# Patient Record
Sex: Female | Born: 1940 | Race: Black or African American | Hispanic: No | Smoking: Former smoker
Health system: Southern US, Community
[De-identification: ages and names within clinical notes are randomized; demographics above are authoritative.]

## PROBLEM LIST (undated history)

## (undated) DIAGNOSIS — N189 Chronic kidney disease, unspecified: Secondary | ICD-10-CM

## (undated) DIAGNOSIS — I5042 Chronic combined systolic (congestive) and diastolic (congestive) heart failure: Secondary | ICD-10-CM

## (undated) DIAGNOSIS — J449 Chronic obstructive pulmonary disease, unspecified: Secondary | ICD-10-CM

## (undated) DIAGNOSIS — I10 Essential (primary) hypertension: Secondary | ICD-10-CM

## (undated) DIAGNOSIS — E785 Hyperlipidemia, unspecified: Secondary | ICD-10-CM

## (undated) HISTORY — PX: APPENDECTOMY: SHX54

---

## 2017-06-01 ENCOUNTER — Encounter: Payer: Self-pay | Admitting: Internal Medicine

## 2017-06-01 ENCOUNTER — Emergency Department: Payer: Medicare Other

## 2017-06-01 ENCOUNTER — Inpatient Hospital Stay
Admission: EM | Admit: 2017-06-01 | Discharge: 2017-06-09 | DRG: 871 | Disposition: A | Discharge status: Hospice | Payer: Medicare Other | Attending: Internal Medicine | Admitting: Internal Medicine

## 2017-06-01 DIAGNOSIS — R0603 Acute respiratory distress: Secondary | ICD-10-CM

## 2017-06-01 DIAGNOSIS — Z87891 Personal history of nicotine dependence: Secondary | ICD-10-CM | POA: Diagnosis not present

## 2017-06-01 DIAGNOSIS — N184 Chronic kidney disease, stage 4 (severe): Secondary | ICD-10-CM | POA: Diagnosis not present

## 2017-06-01 DIAGNOSIS — Z79899 Other long term (current) drug therapy: Secondary | ICD-10-CM

## 2017-06-01 DIAGNOSIS — I130 Hypertensive heart and chronic kidney disease with heart failure and stage 1 through stage 4 chronic kidney disease, or unspecified chronic kidney disease: Secondary | ICD-10-CM | POA: Diagnosis present

## 2017-06-01 DIAGNOSIS — D649 Anemia, unspecified: Secondary | ICD-10-CM | POA: Diagnosis present

## 2017-06-01 DIAGNOSIS — F411 Generalized anxiety disorder: Secondary | ICD-10-CM | POA: Diagnosis present

## 2017-06-01 DIAGNOSIS — I5043 Acute on chronic combined systolic (congestive) and diastolic (congestive) heart failure: Secondary | ICD-10-CM | POA: Diagnosis present

## 2017-06-01 DIAGNOSIS — J9622 Acute and chronic respiratory failure with hypercapnia: Secondary | ICD-10-CM | POA: Diagnosis present

## 2017-06-01 DIAGNOSIS — Z7189 Other specified counseling: Secondary | ICD-10-CM | POA: Diagnosis not present

## 2017-06-01 DIAGNOSIS — Z66 Do not resuscitate: Secondary | ICD-10-CM | POA: Diagnosis present

## 2017-06-01 DIAGNOSIS — J441 Chronic obstructive pulmonary disease with (acute) exacerbation: Secondary | ICD-10-CM | POA: Diagnosis present

## 2017-06-01 DIAGNOSIS — F410 Panic disorder [episodic paroxysmal anxiety] without agoraphobia: Secondary | ICD-10-CM | POA: Diagnosis present

## 2017-06-01 DIAGNOSIS — Z515 Encounter for palliative care: Secondary | ICD-10-CM

## 2017-06-01 DIAGNOSIS — A412 Sepsis due to unspecified staphylococcus: Secondary | ICD-10-CM | POA: Diagnosis present

## 2017-06-01 DIAGNOSIS — I429 Cardiomyopathy, unspecified: Secondary | ICD-10-CM | POA: Diagnosis present

## 2017-06-01 DIAGNOSIS — E785 Hyperlipidemia, unspecified: Secondary | ICD-10-CM | POA: Diagnosis present

## 2017-06-01 DIAGNOSIS — J9621 Acute and chronic respiratory failure with hypoxia: Secondary | ICD-10-CM | POA: Diagnosis present

## 2017-06-01 DIAGNOSIS — I248 Other forms of acute ischemic heart disease: Secondary | ICD-10-CM | POA: Diagnosis present

## 2017-06-01 DIAGNOSIS — G9340 Encephalopathy, unspecified: Secondary | ICD-10-CM | POA: Diagnosis present

## 2017-06-01 DIAGNOSIS — I10 Essential (primary) hypertension: Secondary | ICD-10-CM | POA: Diagnosis present

## 2017-06-01 DIAGNOSIS — R0600 Dyspnea, unspecified: Secondary | ICD-10-CM

## 2017-06-01 DIAGNOSIS — F329 Major depressive disorder, single episode, unspecified: Secondary | ICD-10-CM | POA: Diagnosis present

## 2017-06-01 DIAGNOSIS — Z9981 Dependence on supplemental oxygen: Secondary | ICD-10-CM | POA: Diagnosis not present

## 2017-06-01 DIAGNOSIS — Z7951 Long term (current) use of inhaled steroids: Secondary | ICD-10-CM | POA: Diagnosis not present

## 2017-06-01 HISTORY — DX: Essential (primary) hypertension: I10

## 2017-06-01 HISTORY — DX: Chronic kidney disease, unspecified: N18.9

## 2017-06-01 HISTORY — DX: Chronic obstructive pulmonary disease, unspecified: J44.9

## 2017-06-01 HISTORY — DX: Hyperlipidemia, unspecified: E78.5

## 2017-06-01 HISTORY — DX: Chronic combined systolic (congestive) and diastolic (congestive) heart failure: I50.42

## 2017-06-01 LAB — BASIC METABOLIC PANEL
ANION GAP: 11 (ref 5–15)
BUN: 36 mg/dL — AB (ref 6–20)
CO2: 25 mmol/L (ref 22–32)
Calcium: 9.5 mg/dL (ref 8.9–10.3)
Chloride: 105 mmol/L (ref 101–111)
Creatinine, Ser: 1.86 mg/dL — ABNORMAL HIGH (ref 0.44–1.00)
GFR calc Af Amer: 30 mL/min — ABNORMAL LOW (ref >60)
GFR, EST NON AFRICAN AMERICAN: 26 mL/min — AB (ref >60)
GLUCOSE: 142 mg/dL — AB (ref 65–99)
Potassium: 5.2 mmol/L — ABNORMAL HIGH (ref 3.5–5.1)
Sodium: 141 mmol/L (ref 135–145)

## 2017-06-01 LAB — CBC WITH DIFFERENTIAL/PLATELET
Basophils Absolute: 0.1 10*3/uL (ref 0–0.1)
Basophils Relative: 1 %
Eosinophils Absolute: 0.3 10*3/uL (ref 0–0.7)
Eosinophils Relative: 3 %
HEMATOCRIT: 26.9 % — AB (ref 35.0–47.0)
HEMOGLOBIN: 8.7 g/dL — AB (ref 12.0–16.0)
Lymphocytes Relative: 11 %
Lymphs Abs: 1.1 10*3/uL (ref 1.0–3.6)
MCH: 26.6 pg (ref 26.0–34.0)
MCHC: 32.3 g/dL (ref 32.0–36.0)
MCV: 82.3 fL (ref 80.0–100.0)
MONO ABS: 0.8 10*3/uL (ref 0.2–0.9)
MONOS PCT: 8 %
NEUTROS ABS: 7.9 10*3/uL — AB (ref 1.4–6.5)
Neutrophils Relative %: 77 %
Platelets: 282 10*3/uL (ref 150–440)
RBC: 3.27 MIL/uL — ABNORMAL LOW (ref 3.80–5.20)
RDW: 21 % — AB (ref 11.5–14.5)
WBC: 10.2 10*3/uL (ref 3.6–11.0)

## 2017-06-01 LAB — BLOOD GAS, VENOUS
ACID-BASE EXCESS: 2.6 mmol/L — AB (ref 0.0–2.0)
BICARBONATE: 28.7 mmol/L — AB (ref 20.0–28.0)
FIO2: 1
O2 Saturation: 99.9 %
PATIENT TEMPERATURE: 37
PO2 VEN: 290 mmHg — AB (ref 32.0–45.0)
pCO2, Ven: 52 mmHg (ref 44.0–60.0)
pH, Ven: 7.35 (ref 7.250–7.430)

## 2017-06-01 LAB — PROTIME-INR
INR: 1.03
Prothrombin Time: 13.4 seconds (ref 11.4–15.2)

## 2017-06-01 LAB — BRAIN NATRIURETIC PEPTIDE: B Natriuretic Peptide: 656 pg/mL — ABNORMAL HIGH (ref 0.0–100.0)

## 2017-06-01 LAB — TROPONIN I: Troponin I: 0.05 ng/mL (ref <0.03)

## 2017-06-01 MED ORDER — IPRATROPIUM-ALBUTEROL 0.5-2.5 (3) MG/3ML IN SOLN
3.0000 mL | Freq: Once | RESPIRATORY_TRACT | Status: AC
  Administered 2017-06-01: 3 mL via RESPIRATORY_TRACT

## 2017-06-01 MED ORDER — MORPHINE SULFATE (PF) 2 MG/ML IV SOLN
INTRAVENOUS | Status: AC
  Filled 2017-06-01: qty 1

## 2017-06-01 MED ORDER — MORPHINE SULFATE (PF) 2 MG/ML IV SOLN
2.0000 mg | Freq: Once | INTRAVENOUS | Status: AC
  Administered 2017-06-01: 2 mg via INTRAVENOUS

## 2017-06-01 NOTE — ED Provider Notes (Signed)
[facility] Emergency Department Provider Note ____________________________________________   First MD Initiated Contact with Patient 06/01/17 1914     (approximate)  I have reviewed the triage vital signs and the nursing notes.   HISTORY  Chief Complaint Respiratory Distress  HPI severely limited due to acute respiratory distress  HPI Erica [name] is a 76 y.o. female wth a history of CHF, COPD, and chronic kidney disease who presents from her facility with shortness of breath, acute onset within the last hour and patient was walking.  Patient unable to provide further hx.   No past medical history on file.  There are no active problems to display for this patient.   No past surgical history on file.  Prior to Admission medications   Medication Sig Start Date End Date Taking? Authorizing Provider  allopurinol (ZYLOPRIM) 100 MG tablet Take 100 mg by mouth daily.   Yes [provider]  busPIRone (BUSPAR) 7.5 MG tablet Take 7.5 mg by mouth 3 (three) times daily.   Yes [provider]  citalopram (CELEXA) 20 MG tablet Take 20 mg by mouth daily.   Yes [provider]  fluticasone (FLONASE) 50 MCG/ACT nasal spray Place 2 sprays into both nostrils daily.   Yes [provider]  lidocaine (XYLOCAINE) 5 % ointment Apply 1 application topically daily as needed.   Yes [provider]  metolazone (ZAROXOLYN) 2.5 MG tablet Take 2.5 mg by mouth daily.   Yes [provider]  mirtazapine (REMERON) 15 MG tablet Take 15 mg by mouth at bedtime.   Yes [provider]  omeprazole (PRILOSEC) 40 MG capsule Take 40 mg by mouth daily.   Yes [provider]  oxyCODONE (OXY IR/ROXICODONE) 5 MG immediate release tablet Take 5 mg by mouth every 4 (four) hours as needed for severe pain.   Yes [provider]  predniSONE (DELTASONE) 1 MG tablet Take 2 mg by mouth daily with breakfast.   Yes [provider]  roflumilast (DALIRESP) 500 MCG TABS tablet Take 500 mcg by mouth daily.   Yes [provider]  spironolactone (ALDACTONE) 25 MG tablet Take 25 mg by mouth daily.   Yes [provider]  tiotropium (SPIRIVA) 18 MCG inhalation capsule Place 18 mcg into inhaler and inhale daily.   Yes [provider]    Allergies Patient has no known allergies.  No family history on file.  Social History Social History  Substance Use Topics  . Smoking status: Not on file  . Smokeless tobacco: Not on file  . Alcohol use Not on file    Review of Systems Level V caveat: unable to obtain ROS due to acute respiratory distress   ____________________________________________   PHYSICAL EXAM:  VITAL SIGNS: ED Triage Vitals [06/01/17 1915]  Enc Vitals Group     BP (!) 154/86     Pulse Rate (!) 114     Resp (!) 30     Temp 98.3 F (36.8 C)     Temp Source Axillary     SpO2 100 %     Weight      Height      Head Circumference      Peak Flow      Pain Score 9     Pain Loc      Pain Edu?      Excl. in GC?     Constitutional: Alert, uncomfortable appearing.  Eyes: Conjunctivae are normal.  Head: Atraumatic. Nose: No  congestion/rhinnorhea. Mouth/Throat: Mucous membranes are slightly dry.   Neck: Normal range of motion.  Cardiovascular: Tachycardia, regular rhythm. Grossly normal heart sounds.  Good peripheral circulation. Respiratory: Moderate resp distress. Coarse breath sounds and scattered wheeze.  No rales.  Gastrointestinal: No distention.  Genitourinary: No CVA tenderness. Musculoskeletal: No lower extremity edema.  Extremities warm and well perfused.  Neurologic:  Normal speech and language. No gross focal neurologic deficits are appreciated.  Skin:  Skin is warm and dry. No rash noted. Psychiatric: Mood and affect are normal. Speech and behavior are normal.  ____________________________________________   LABS (all labs ordered are  listed, but only abnormal results are displayed)  Labs Reviewed  BASIC METABOLIC PANEL - Abnormal; Notable for the following:       Result Value   Potassium 5.2 (*)    Glucose, Bld 142 (*)    BUN 36 (*)    Creatinine, Ser 1.86 (*)    GFR calc non Af Amer 26 (*)    GFR calc Af Amer 30 (*)    All other components within normal limits  CBC WITH DIFFERENTIAL/PLATELET - Abnormal; Notable for the following:    RBC 3.27 (*)    Hemoglobin 8.7 (*)    HCT 26.9 (*)    RDW 21.0 (*)    Neutro Abs 7.9 (*)    All other components within normal limits  BRAIN NATRIURETIC PEPTIDE - Abnormal; Notable for the following:    B Natriuretic Peptide 656.0 (*)    All other components within normal limits  TROPONIN I - Abnormal; Notable for the following:    Troponin I 0.05 (*)    All other components within normal limits  BLOOD GAS, VENOUS - Abnormal; Notable for the following:    pO2, Ven 290.0 (*)    Bicarbonate 28.7 (*)    Acid-Base Excess 2.6 (*)    All other components within normal limits  CULTURE, BLOOD (ROUTINE X 2)  CULTURE, BLOOD (ROUTINE X 2)  PROTIME-INR  URINALYSIS, COMPLETE (UACMP) WITH MICROSCOPIC   ____________________________________________  EKG  ED ECG REPORT I, Dionne Bucy, the attending physician, personally viewed and interpreted this ECG.  Date: 06/01/2017 EKG Time: 19:14 Rate: 115 Rhythm: sinus tachycardia, PVCs QRS Axis: normal Intervals: normal ST/T Wave abnormalities: nonspecific T-wave flattening laterally Narrative Interpretation: nonspecific findings, no evidence of acute ischemia; no old EKG available for comparison  ____________________________________________  RADIOLOGY  CXR: Hyperinflation with emphasematous changes.  No infiltrate or pulmonary edema.  ____________________________________________   PROCEDURES  Procedure(s) performed: No    Critical Care performed: Yes  CRITICAL CARE Performed by: Dionne Bucy   Total critical  care time: 35 minutes  Critical care time was exclusive of separately billable procedures and treating other patients.  Critical care was necessary to treat or prevent imminent or life-threatening deterioration.  Critical care was time spent personally by me on the following activities: development of treatment plan with patient and/or surrogate as well as nursing, discussions with consultants, evaluation of patient's response to treatment, examination of patient, obtaining history from patient or surrogate, ordering and performing treatments and interventions, ordering and review of laboratory studies, ordering and review of radiographic studies, pulse oximetry and re-evaluation of patient's condition.  ____________________________________________   INITIAL IMPRESSION / ASSESSMENT AND PLAN / ED COURSE  Pertinent labs & imaging results that were available during my care of the patient were reviewed by me and considered in my medical decision making (see chart for details).  76 year old female with a history  of COPD and CHF presents with acute onset of shortness of breath, in respiratory distress. Patient was placed on CPAP and given Solu-Medrol by EMS. On arrival patient tachypneic and uncomfortable appearing. Maintaining O2 sat 100% after being placed on BiPAP in the emergency department.  Initial clinical presentation most consistent with acute COPD exacerbation rather than CHF, given no rales and no peripheral edema.  Also consider pna.  Bedside sono performed by me and CXR also consistent with COPD rather than CHF.  Pt given continuous nebs and had rapid improvement on BiPAP.  Plan for admission for COPD.  ----------------------------------------- 8:48 PM on 06/01/2017 -----------------------------------------  Patient remains stable on the BiPAP. Signed out to hospitalist for admission.  No evidence by CXR or labs for pna.       ____________________________________________   FINAL  CLINICAL IMPRESSION(S) / ED DIAGNOSES  Final diagnoses:  COPD exacerbation (HCC)  Respiratory distress      NEW MEDICATIONS STARTED DURING THIS VISIT:  New Prescriptions   No medications on file     Note:  This document was prepared using Dragon voice recognition software and may include unintentional dictation errors.    Dionne Bucy, MD 06/01/17 2050

## 2017-06-01 NOTE — ED Notes (Signed)
Pt's daughter to nurses station to ask about time. Visitors in room given coffee by this RN and informed that their nurse is waiting on a bed for the pt.

## 2017-06-01 NOTE — Progress Notes (Signed)
Pt now on home settings [facility] tolerating well. Pt awake and alert without any signs of respiratory distress

## 2017-06-01 NOTE — ED Notes (Signed)
Pt cleansed of incontinent urine.  

## 2017-06-01 NOTE — ED Notes (Signed)
Delay in taking pt to floor, attempting to wean pt from bipap. Rt in to remove bipap and place pt on different delivery of oxygen.

## 2017-06-01 NOTE — ED Notes (Signed)
Po fluids provided. Pt updated on delay to room.

## 2017-06-01 NOTE — ED Notes (Signed)
Pt requesting medication for leg pain.

## 2017-06-01 NOTE — ED Triage Notes (Signed)
Pt arrives ems with resp distress. Pt arrives on bipap, solumedrol  given iv by ems. Pt with diaphoretic skin, normal color, sinus tach on monitor, md at bedside on arrival. Per ems pt with ra pox of 88% on their arrival.

## 2017-06-01 NOTE — H&P (Signed)
[facility] [place] at [facility]   PATIENT NAME: Erica [name]    MR#:  [id number]  DATE OF BIRTH:  [birth year]  DATE OF ADMISSION:  06/01/2017  PRIMARY CARE PHYSICIAN: System, Pcp Not In   REQUESTING/REFERRING PHYSICIAN: Siadecki, MD  CHIEF COMPLAINT:   Chief Complaint  Patient presents with  . Respiratory Distress    HISTORY OF PRESENT ILLNESS:  Erica [name]  is a 76 y.o. female who presents with Progressive shortness of breath over the last several days. Patient has a history of severe COPD as well as congestive heart failure. She came in to the ED today when her symptoms became severe. Initially she was somewhat hypoxic and required BiPAP. She is able to wean off the BiPAP in the ED. Workup was consistent with COPD exacerbation, as well as potentially some mild heart failure exacerbation. Hospitalists were called for admission  PAST MEDICAL HISTORY:   Past Medical History:  Diagnosis Date  . Chronic combined systolic and diastolic CHF (congestive heart failure) (HCC)   . CKD (chronic kidney disease)   . COPD (chronic obstructive pulmonary disease) (HCC)   . HLD (hyperlipidemia)   . HTN (hypertension)     PAST SURGICAL HISTORY:   Past Surgical History:  Procedure Laterality Date  . APPENDECTOMY      SOCIAL HISTORY:   Social History  Substance Use Topics  . Smoking status: Former Games developer  . Smokeless tobacco: Not on file  . Alcohol use No    FAMILY HISTORY:   Family History  Problem Relation Age of Onset  . Cancer Mother   . Hypertension Mother   . Heart disease Sister     DRUG ALLERGIES:  No Known Allergies  MEDICATIONS AT HOME:   Prior to Admission medications   Medication Sig Start Date End Date Taking? Authorizing Provider  allopurinol (ZYLOPRIM) 100 MG tablet Take 100 mg by mouth daily.   Yes [provider]  busPIRone (BUSPAR) 7.5 MG tablet Take 7.5 mg by mouth 3 (three) times daily.   Yes [provider]  citalopram (CELEXA) 20 MG tablet Take 20 mg by mouth daily.   Yes [provider]  fluticasone (FLONASE) 50 MCG/ACT nasal spray Place 2 sprays into both nostrils daily.   Yes [provider]  lidocaine (XYLOCAINE) 5 % ointment Apply 1 application topically daily as needed.   Yes [provider]  metolazone (ZAROXOLYN) 2.5 MG tablet Take 2.5 mg by mouth daily.   Yes [provider]  mirtazapine (REMERON) 15 MG tablet Take 15 mg by mouth at bedtime.   Yes [provider]  omeprazole (PRILOSEC) 40 MG capsule Take 40 mg by mouth daily.   Yes [provider]  oxyCODONE (OXY IR/ROXICODONE) 5 MG immediate release tablet Take 5 mg by mouth every 4 (four) hours as needed for severe pain.   Yes [provider]  predniSONE (DELTASONE) 1 MG tablet Take 2 mg by mouth daily with breakfast.   Yes [provider]  roflumilast (DALIRESP) 500 MCG TABS tablet Take 500 mcg by mouth daily.   Yes [provider]  spironolactone (ALDACTONE) 25 MG tablet Take 25 mg by mouth daily.   Yes [provider]  tiotropium (SPIRIVA) 18 MCG inhalation capsule Place 18 mcg into inhaler and inhale daily.   Yes [provider]    REVIEW OF SYSTEMS:  Review of Systems  Constitutional: Negative for chills, fever, malaise/fatigue and weight loss.  HENT: Negative for  ear pain, hearing loss and tinnitus.   Eyes: Negative for blurred vision, double vision, pain and redness.  Respiratory: Positive for cough, shortness of breath and wheezing. Negative for hemoptysis.   Cardiovascular: Negative for chest pain, palpitations, orthopnea and leg swelling.  Gastrointestinal: Negative for abdominal pain, constipation, diarrhea, nausea and vomiting.  Genitourinary: Negative for dysuria, frequency and hematuria.  Musculoskeletal: Negative for back pain, joint pain and neck pain.  Skin:       No acne, rash, or lesions   Neurological: Negative for dizziness, tremors, focal weakness and weakness.  Endo/Heme/Allergies: Negative for polydipsia. Does not bruise/bleed easily.  Psychiatric/Behavioral: Negative for depression. The patient is not nervous/anxious and does not have insomnia.      VITAL SIGNS:   Vitals:   06/01/17 2045 06/01/17 2100 06/01/17 2115 06/01/17 2130  BP:  120/82  123/70  Pulse: (!) 108 (!) 110 (!) 107 (!) 111  Resp: 17 (!) Temp:      TempSrc:      SpO2: 100% 100% 100% 100%  Weight:      Height:       Wt Readings from Last 3 Encounters:  06/01/17 76.4 kg (168 lb 6.9 oz)    PHYSICAL EXAMINATION:  Physical Exam  Vitals reviewed. Constitutional: She is oriented to person, place, and time. She appears well-developed and well-nourished. No distress.  HENT:  Head: Normocephalic and atraumatic.  Mouth/Throat: Oropharynx is clear and moist.  Eyes: Pupils are equal, round, and reactive to light. Conjunctivae and EOM are normal. No scleral icterus.  Neck: Normal range of motion. Neck supple. No JVD present. No thyromegaly present.  Cardiovascular: Normal rate, regular rhythm and intact distal pulses.  Exam reveals no gallop and no friction rub.   No murmur heard. Respiratory: Effort normal. No respiratory distress. She has wheezes. She has rales.  GI: Soft. Bowel sounds are normal. She exhibits no distension. There is no tenderness.  Musculoskeletal: Normal range of motion. She exhibits no edema.  No arthritis, no gout  Lymphadenopathy:    She has no cervical adenopathy.  Neurological: She is alert and oriented to person, place, and time. No cranial nerve deficit.  No dysarthria, no aphasia  Skin: Skin is warm and dry. No rash noted. No erythema.  Psychiatric: She has a normal mood and affect. Her behavior is normal. Judgment and thought content normal.    LABORATORY PANEL:   CBC  Recent Labs Lab 06/01/17 1916  WBC 10.2  HGB 8.7*  HCT 26.9*  PLT 282    ------------------------------------------------------------------------------------------------------------------  Chemistries   Recent Labs Lab 06/01/17 1916  NA 141  K 5.2*  CL 105  CO2 25  GLUCOSE 142*  BUN 36*  CREATININE 1.86*  CALCIUM 9.5   ------------------------------------------------------------------------------------------------------------------  Cardiac Enzymes  Recent Labs Lab 06/01/17 1916  TROPONINI 0.05*   ------------------------------------------------------------------------------------------------------------------  RADIOLOGY:  Dg Chest Portable 1 View  Result Date: 06/01/2017 CLINICAL DATA:  Shortness of breath EXAM: PORTABLE CHEST 1 VIEW COMPARISON:  None. FINDINGS: Hyperinflation with emphysematous changes. No acute consolidation. Linear scarring or atelectasis at the bases. No pleural effusion. Borderline cardiomegaly. Aortic atherosclerosis. No pneumothorax. IMPRESSION: Hyperinflation with emphysematous changes. Linear scarring or atelectasis at both bases Electronically Signed   By: Jasmine Pang M.D.   On: 06/01/2017 19:43    EKG:   Orders placed or performed during the hospital encounter of 06/01/17  . EKG 12-Lead  . EKG 12-Lead  . ED EKG  . ED EKG  IMPRESSION AND PLAN:  Principal Problem:   COPD with acute exacerbation (HCC) - IV Solu-Medrol, azithromycin, duo nebs, when necessary antitussive Active Problems:   Acute on chronic combined systolic and diastolic CHF (congestive heart failure) (HCC) - BNP is mildly elevated at around 600, though she has an unknown baseline. Chart review from outside facility shows proBNP values consistently elevated. We will get an echocardiogram and cardiology consult   HTN (hypertension) - continue home antihypertensives   CKD (chronic kidney disease), stage IV (HCC) - renal function at baseline, avoid nephrotoxins and monitor   HLD (hyperlipidemia) - home dose statin  All the records are  reviewed and case discussed with ED provider. Management plans discussed with the patient and/or family.  DVT PROPHYLAXIS: SubQ heparin  GI PROPHYLAXIS: None  ADMISSION STATUS: Inpatient  CODE STATUS: Full Code Status History    This patient does not have a recorded code status. Please follow your organizational policy for patients in this situation.      TOTAL TIME TAKING CARE OF THIS PATIENT: 45 minutes.   Selma Rodelo FIELDING 06/01/2017, 9:34 PM  [employer]  [phone]  CC: Primary care physician; System, Pcp Not In  Note:  This document was prepared using Dragon voice recognition software and may include unintentional dictation errors.

## 2017-06-01 NOTE — ED Notes (Signed)
Pt reports feeling improved, breath sounds improved. Family remains at bedise.

## 2017-06-01 NOTE — ED Notes (Signed)
Critical troponin of 0.05 called from lab. Dr. Guss Bunde notified, no new orders received.

## 2017-06-01 NOTE — ED Notes (Signed)
Pt tolerating removal from bipap well. Pt repositioned in bed for comfort.

## 2017-06-02 ENCOUNTER — Encounter: Payer: Self-pay | Admitting: General Practice

## 2017-06-02 ENCOUNTER — Inpatient Hospital Stay
Admit: 2017-06-02 | Discharge: 2017-06-02 | Disposition: A | Discharge status: Home / Self Care | Payer: Medicare Other | Attending: Internal Medicine | Admitting: Internal Medicine

## 2017-06-02 LAB — BASIC METABOLIC PANEL
Anion gap: 7 (ref 5–15)
BUN: 37 mg/dL — AB (ref 6–20)
CO2: 27 mmol/L (ref 22–32)
CREATININE: 1.7 mg/dL — AB (ref 0.44–1.00)
Calcium: 9 mg/dL (ref 8.9–10.3)
Chloride: 105 mmol/L (ref 101–111)
GFR calc Af Amer: 33 mL/min — ABNORMAL LOW (ref >60)
GFR, EST NON AFRICAN AMERICAN: 28 mL/min — AB (ref >60)
Glucose, Bld: 161 mg/dL — ABNORMAL HIGH (ref 65–99)
Potassium: 4.9 mmol/L (ref 3.5–5.1)
SODIUM: 139 mmol/L (ref 135–145)

## 2017-06-02 LAB — CBC
HCT: 25.6 % — ABNORMAL LOW (ref 35.0–47.0)
Hemoglobin: 8.3 g/dL — ABNORMAL LOW (ref 12.0–16.0)
MCH: 26.7 pg (ref 26.0–34.0)
MCHC: 32.5 g/dL (ref 32.0–36.0)
MCV: 82.3 fL (ref 80.0–100.0)
PLATELETS: 246 10*3/uL (ref 150–440)
RBC: 3.11 MIL/uL — ABNORMAL LOW (ref 3.80–5.20)
RDW: 20.8 % — AB (ref 11.5–14.5)
WBC: 5 10*3/uL (ref 3.6–11.0)

## 2017-06-02 LAB — ECHOCARDIOGRAM COMPLETE
HEIGHTINCHES: 65 in
Weight: 2416 oz

## 2017-06-02 LAB — TROPONIN I
TROPONIN I: 0.03 ng/mL — AB (ref <0.03)
Troponin I: 0.04 ng/mL (ref <0.03)

## 2017-06-02 LAB — BLOOD CULTURE ID PANEL (REFLEXED)
ACINETOBACTER BAUMANNII: NOT DETECTED
CANDIDA ALBICANS: NOT DETECTED
CANDIDA TROPICALIS: NOT DETECTED
Candida glabrata: NOT DETECTED
Candida krusei: NOT DETECTED
Candida parapsilosis: NOT DETECTED
ENTEROBACTERIACEAE SPECIES: NOT DETECTED
Enterobacter cloacae complex: NOT DETECTED
Enterococcus species: NOT DETECTED
Escherichia coli: NOT DETECTED
HAEMOPHILUS INFLUENZAE: NOT DETECTED
Klebsiella oxytoca: NOT DETECTED
Klebsiella pneumoniae: NOT DETECTED
Listeria monocytogenes: NOT DETECTED
METHICILLIN RESISTANCE: DETECTED — AB
NEISSERIA MENINGITIDIS: NOT DETECTED
PSEUDOMONAS AERUGINOSA: NOT DETECTED
Proteus species: NOT DETECTED
SERRATIA MARCESCENS: NOT DETECTED
STAPHYLOCOCCUS AUREUS BCID: NOT DETECTED
STREPTOCOCCUS PNEUMONIAE: NOT DETECTED
STREPTOCOCCUS PYOGENES: NOT DETECTED
STREPTOCOCCUS SPECIES: NOT DETECTED
Staphylococcus species: DETECTED — AB
Streptococcus agalactiae: NOT DETECTED

## 2017-06-02 LAB — GLUCOSE, CAPILLARY: GLUCOSE-CAPILLARY: 141 mg/dL — AB (ref 65–99)

## 2017-06-02 LAB — MRSA PCR SCREENING: MRSA by PCR: NEGATIVE

## 2017-06-02 MED ORDER — AZITHROMYCIN 500 MG PO TABS
500.0000 mg | ORAL_TABLET | Freq: Every day | ORAL | Status: DC
  Administered 2017-06-02 – 2017-06-03 (×2): 500 mg via ORAL
  Filled 2017-06-02 (×5): qty 1

## 2017-06-02 MED ORDER — ORAL CARE MOUTH RINSE
15.0000 mL | Freq: Two times a day (BID) | OROMUCOSAL | Status: DC
  Administered 2017-06-03 – 2017-06-09 (×10): 15 mL via OROMUCOSAL

## 2017-06-02 MED ORDER — IPRATROPIUM-ALBUTEROL 0.5-2.5 (3) MG/3ML IN SOLN
3.0000 mL | RESPIRATORY_TRACT | Status: DC | PRN
  Administered 2017-06-02 (×2): 3 mL via RESPIRATORY_TRACT
  Filled 2017-06-02 (×2): qty 3

## 2017-06-02 MED ORDER — CLONAZEPAM 0.5 MG PO TABS
0.2500 mg | ORAL_TABLET | Freq: Two times a day (BID) | ORAL | Status: DC | PRN
  Administered 2017-06-02 – 2017-06-05 (×3): 0.25 mg via ORAL
  Filled 2017-06-02 (×4): qty 1

## 2017-06-02 MED ORDER — ACETAMINOPHEN 650 MG RE SUPP: 650.0000 mg | Freq: Four times a day (QID) | RECTAL | Status: DC | PRN

## 2017-06-02 MED ORDER — PANTOPRAZOLE SODIUM 40 MG PO TBEC
40.0000 mg | DELAYED_RELEASE_TABLET | Freq: Every day | ORAL | Status: DC
  Administered 2017-06-02 – 2017-06-06 (×3): 40 mg via ORAL
  Filled 2017-06-02 (×3): qty 1

## 2017-06-02 MED ORDER — ONDANSETRON HCL 4 MG/2ML IJ SOLN: 4.0000 mg | Freq: Four times a day (QID) | INTRAMUSCULAR | Status: DC | PRN

## 2017-06-02 MED ORDER — CARVEDILOL 3.125 MG PO TABS
3.1250 mg | ORAL_TABLET | Freq: Two times a day (BID) | ORAL | Status: DC
  Administered 2017-06-02 – 2017-06-07 (×4): 3.125 mg via ORAL
  Filled 2017-06-02 (×5): qty 1

## 2017-06-02 MED ORDER — SPIRONOLACTONE 25 MG PO TABS
25.0000 mg | ORAL_TABLET | Freq: Every day | ORAL | Status: DC
  Administered 2017-06-02: 25 mg via ORAL
  Filled 2017-06-02: qty 1

## 2017-06-02 MED ORDER — MOMETASONE FURO-FORMOTEROL FUM 200-5 MCG/ACT IN AERO
2.0000 | INHALATION_SPRAY | Freq: Two times a day (BID) | RESPIRATORY_TRACT | Status: DC
  Administered 2017-06-02 – 2017-06-06 (×4): 2 via RESPIRATORY_TRACT
  Filled 2017-06-02: qty 8.8

## 2017-06-02 MED ORDER — ONDANSETRON HCL 4 MG PO TABS
4.0000 mg | ORAL_TABLET | Freq: Four times a day (QID) | ORAL | Status: DC | PRN
  Administered 2017-06-02: 4 mg via ORAL
  Filled 2017-06-02: qty 1

## 2017-06-02 MED ORDER — ROFLUMILAST 500 MCG PO TABS
500.0000 ug | ORAL_TABLET | Freq: Every day | ORAL | Status: DC
  Administered 2017-06-02 – 2017-06-06 (×3): 500 ug via ORAL
  Filled 2017-06-02 (×6): qty 1

## 2017-06-02 MED ORDER — CITALOPRAM HYDROBROMIDE 20 MG PO TABS
20.0000 mg | ORAL_TABLET | Freq: Every day | ORAL | Status: DC
  Administered 2017-06-02 – 2017-06-06 (×3): 20 mg via ORAL
  Filled 2017-06-02 (×4): qty 1

## 2017-06-02 MED ORDER — IPRATROPIUM-ALBUTEROL 0.5-2.5 (3) MG/3ML IN SOLN
3.0000 mL | Freq: Three times a day (TID) | RESPIRATORY_TRACT | Status: DC
  Administered 2017-06-02 (×2): 3 mL via RESPIRATORY_TRACT
  Filled 2017-06-02 (×2): qty 3

## 2017-06-02 MED ORDER — TIOTROPIUM BROMIDE MONOHYDRATE 18 MCG IN CAPS
18.0000 ug | ORAL_CAPSULE | Freq: Every day | RESPIRATORY_TRACT | Status: DC
  Administered 2017-06-02: 18 ug via RESPIRATORY_TRACT
  Filled 2017-06-02: qty 5

## 2017-06-02 MED ORDER — BUSPIRONE HCL 15 MG PO TABS
7.5000 mg | ORAL_TABLET | Freq: Three times a day (TID) | ORAL | Status: DC
  Administered 2017-06-02 – 2017-06-06 (×8): 7.5 mg via ORAL
  Filled 2017-06-02 (×16): qty 1

## 2017-06-02 MED ORDER — METHYLPREDNISOLONE SODIUM SUCC 125 MG IJ SOLR
60.0000 mg | Freq: Four times a day (QID) | INTRAMUSCULAR | Status: DC
  Administered 2017-06-02 – 2017-06-05 (×13): 60 mg via INTRAVENOUS
  Filled 2017-06-02 (×14): qty 2

## 2017-06-02 MED ORDER — SACUBITRIL-VALSARTAN 24-26 MG PO TABS
1.0000 | ORAL_TABLET | Freq: Two times a day (BID) | ORAL | Status: DC
  Administered 2017-06-02 – 2017-06-06 (×5): 1 via ORAL
  Filled 2017-06-02 (×12): qty 1

## 2017-06-02 MED ORDER — HYDROCOD POLST-CPM POLST ER 10-8 MG/5ML PO SUER
5.0000 mL | Freq: Two times a day (BID) | ORAL | Status: DC
  Administered 2017-06-02 – 2017-06-06 (×6): 5 mL via ORAL
  Filled 2017-06-02 (×6): qty 5

## 2017-06-02 MED ORDER — HYDROCODONE-ACETAMINOPHEN 5-325 MG PO TABS
1.0000 | ORAL_TABLET | ORAL | Status: DC | PRN
  Administered 2017-06-02 – 2017-06-03 (×5): 1 via ORAL
  Filled 2017-06-02 (×6): qty 1

## 2017-06-02 MED ORDER — FUROSEMIDE 20 MG PO TABS
20.0000 mg | ORAL_TABLET | Freq: Every day | ORAL | Status: DC
  Administered 2017-06-02: 20 mg via ORAL
  Filled 2017-06-02 (×2): qty 1

## 2017-06-02 MED ORDER — HEPARIN SODIUM (PORCINE) 5000 UNIT/ML IJ SOLN
5000.0000 [IU] | Freq: Three times a day (TID) | INTRAMUSCULAR | Status: DC
  Administered 2017-06-02 – 2017-06-06 (×13): 5000 [IU] via SUBCUTANEOUS
  Filled 2017-06-02 (×13): qty 1

## 2017-06-02 MED ORDER — VANCOMYCIN HCL IN DEXTROSE 1-5 GM/200ML-% IV SOLN
1000.0000 mg | Freq: Once | INTRAVENOUS | Status: AC
  Administered 2017-06-02: 1000 mg via INTRAVENOUS
  Filled 2017-06-02: qty 200

## 2017-06-02 MED ORDER — ACETAMINOPHEN 325 MG PO TABS
650.0000 mg | ORAL_TABLET | Freq: Four times a day (QID) | ORAL | Status: DC | PRN
  Administered 2017-06-03 – 2017-06-05 (×2): 650 mg via ORAL
  Filled 2017-06-02 (×2): qty 2

## 2017-06-02 NOTE — Progress Notes (Signed)
[facility] [place] at [facility]   PATIENT NAME: Erica [name]    MR#:  [id number]  DATE OF BIRTH:  10/23/1940  SUBJECTIVE:  CHIEF COMPLAINT:   Chief Complaint  Patient presents with  . Respiratory Distress   - Still complains of significant shortness of breath. On chronic home oxygen. -Off BiPAP at this time. Has cough and nausea today  REVIEW OF SYSTEMS:  Review of Systems  Constitutional: Positive for malaise/fatigue. Negative for chills and fever.  HENT: Negative for congestion, ear discharge, hearing loss and nosebleeds.   Eyes: Negative for blurred vision.  Respiratory: Positive for cough, sputum production and shortness of breath. Negative for wheezing.   Cardiovascular: Negative for chest pain, palpitations and leg swelling.  Gastrointestinal: Positive for nausea and vomiting. Negative for abdominal pain, constipation and diarrhea.  Genitourinary: Negative for dysuria and urgency.  Musculoskeletal: Positive for myalgias.  Neurological: Negative for dizziness, speech change, focal weakness, seizures and headaches.  Psychiatric/Behavioral: Negative for depression.    DRUG ALLERGIES:  No Known Allergies  VITALS:  Blood pressure 120/68, pulse 97, temperature 98.4 F (36.9 C), temperature source Oral, resp. rate (!) 22, height  (1.651 m), weight 68.5 kg (151 lb), SpO2 100 %.  PHYSICAL EXAMINATION:  Physical Exam  GENERAL:  76 y.o.-year-old patient lying in the bed with no acute distress.  EYES: Pupils equal, round, reactive to light and accommodation. No scleral icterus. Extraocular muscles intact.  HEENT: Head atraumatic, normocephalic. Oropharynx and nasopharynx clear.  NECK:  Supple, no jugular venous distention. No thyroid enlargement, no tenderness.  LUNGS: scant breath sounds bilaterally with scattered wheezing, no rales,rhonchi or crepitation. No use of accessory muscles of respiration.  CARDIOVASCULAR: S1, S2 normal. No rubs, or  gallops. 2/6 systolic murmur present. ABDOMEN: Soft, nontender, nondistended. Bowel sounds present. No organomegaly or mass.  EXTREMITIES: No pedal edema, cyanosis, or clubbing.  NEUROLOGIC: Cranial nerves II through XII are intact. Muscle strength 5/5 in all extremities. Sensation intact. Gait not checked. Global weakness noted. PSYCHIATRIC: The patient is alert and oriented x 3.  SKIN: No obvious rash, lesion, or ulcer.    LABORATORY PANEL:   CBC  Recent Labs Lab 06/02/17 0619  WBC 5.0  HGB 8.3*  HCT 25.6*  PLT 246   ------------------------------------------------------------------------------------------------------------------  Chemistries   Recent Labs Lab 06/02/17 0619  NA 139  K 4.9  CL 105  CO2 27  GLUCOSE 161*  BUN 37*  CREATININE 1.70*  CALCIUM 9.0   ------------------------------------------------------------------------------------------------------------------  Cardiac Enzymes  Recent Labs Lab 06/02/17 0619  TROPONINI 0.03*   ------------------------------------------------------------------------------------------------------------------  RADIOLOGY:  Dg Chest Portable 1 View  Result Date: 06/01/2017 CLINICAL DATA:  Shortness of breath EXAM: PORTABLE CHEST 1 VIEW COMPARISON:  None. FINDINGS: Hyperinflation with emphysematous changes. No acute consolidation. Linear scarring or atelectasis at the bases. No pleural effusion. Borderline cardiomegaly. Aortic atherosclerosis. No pneumothorax. IMPRESSION: Hyperinflation with emphysematous changes. Linear scarring or atelectasis at both bases Electronically Signed   By: Jasmine Pang M.D.   On: 06/01/2017 19:43    EKG:   Orders placed or performed during the hospital encounter of 06/01/17  . EKG 12-Lead  . EKG 12-Lead    ASSESSMENT AND PLAN:   76 year old female with past medical history significant for chronic respiratory failure secondary to COPD and combined CHF, on 2 L home oxygen at rest, CK D  and hypertension presents from [facility] nursing home secondary to worsening shortness of breath  #1 acute on chronic hypoxic  respiratory failure-secondary to COPD exacerbation and CHF exacerbation. - Off BiPAP now. -Currently on 5 L via nasal cannula, addressed uses 2 L at the nursing home and on exertion uses up to 6 L at baseline -Continue steroids, had cough medications. -Continue Lasix -On azithromycin and 2 nebs. - also has systolic heart failure, echo with EF of 25%. Appreciate cardiology consult. And entresto started  #2 depression and anxiety-continue home medications. 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 Avoid nephrotoxins and monitor  #5 DVT prophylaxis heparin subcutaneous heparin     All the records are reviewed and case discussed with Care Management/Social Workerr. Management plans discussed with the patient, family and they are in agreement.  CODE STATUS: Full Code  TOTAL TIME TAKING CARE OF THIS PATIENT: 37 minutes.   POSSIBLE D/C IN 2 DAYS, DEPENDING ON CLINICAL CONDITION.   Dylanie Quesenberry M.D on 06/02/2017 at 1:20 PM  Between 7am to 6pm - Pager - [phone]  After 6pm go to [email or web address] - password [email or web address]  [facility] [place] [facility]  Office  [phone]  CC: Primary care physician; System, Pcp Not In

## 2017-06-02 NOTE — Progress Notes (Signed)
PHARMACY - PHYSICIAN COMMUNICATION CRITICAL VALUE ALERT - BLOOD CULTURE IDENTIFICATION (BCID)  No results found for this or any previous visit.   06/02/2017 20:02 lab called to report GPC 1 of 4 bottles (anaerobic) showing staphylococcal species, mecA detected.   Name of physician (or Provider) Contacted: Dr. Katheren Shams (on call pager [phone]) x [phone]  Changes to prescribed antibiotics required: Dr. Katheren Shams would like to give one time dose of vancomycin and see if anything else grows. CrCl 25 to 30 mL/min, ABW 68.5 kg. Will give vancomycin 1 gm IV x 1.  Carola Frost, Pharm.D., BCPS Clinical Pharmacist 06/02/2017  8:03 PM

## 2017-06-02 NOTE — [place] FL2 (Signed)
[place] MEDICAID FL2 LEVEL OF CARE SCREENING TOOL     IDENTIFICATION  Patient Name: Erica [name] Birthdate: 1940-10-13 Sex: female Admission Date (Current Location): 06/01/2017  [place] and [place] Number:  Chiropodist and Address:  [facility], [address] [place], [place] [address]      Provider Number: [id number]  Attending Physician Name and Address:  Enid Baas, MD  Relative Name and Phone Number:  Concha Norway (daughter) [phone]    Current Level of Care: Hospital Recommended Level of Care: Skilled Nursing Facility Prior Approval Number:    Date Approved/Denied: 05/17/17 PASRR Number: [id number]  Discharge Plan: SNF    Current Diagnoses: Patient Active Problem List   Diagnosis Date Noted  . COPD with acute exacerbation (HCC) 06/01/2017  . HTN (hypertension) 06/01/2017  . HLD (hyperlipidemia) 06/01/2017  . Acute on chronic combined systolic and diastolic CHF (congestive heart failure) (HCC) 06/01/2017  . CKD (chronic kidney disease), stage IV (HCC) 06/01/2017    Orientation RESPIRATION BLADDER Height & Weight     Self, Time, Situation, Place  O2 (3L o2) Incontinent Weight: 151 lb (68.5 kg) Height:   (165.1 cm)  BEHAVIORAL SYMPTOMS/MOOD NEUROLOGICAL BOWEL NUTRITION STATUS      Continent    AMBULATORY STATUS COMMUNICATION OF NEEDS Skin   Extensive Assist Verbally Normal                       Personal Care Assistance Level of Assistance  Bathing, Feeding, Dressing Bathing Assistance: Limited assistance Feeding assistance: Independent Dressing Assistance: Limited assistance     Functional Limitations Info             SPECIAL CARE FACTORS FREQUENCY  PT (By licensed PT)     PT Frequency: Resume previous PT orders              Contractures Contractures Info: Not present    Additional Factors Info  Code Status, Allergies, Psychotropic Code Status Info: Full Allergies  Info: NKA Psychotropic Info: Celexa, Buspar         Current Medications (06/02/2017):  This is the current hospital active medication list Current Facility-Administered Medications  Medication Dose Route Frequency Provider Last Rate Last Dose  . acetaminophen (TYLENOL) tablet 650 mg  650 mg Oral Q6H PRN Oralia Manis, MD       Or  . acetaminophen (TYLENOL) suppository 650 mg  650 mg Rectal Q6H PRN Oralia Manis, MD      . azithromycin [facility]) tablet 500 mg  500 mg Oral Daily Oralia Manis, MD   500 mg at 06/02/17 1019  . busPIRone (BUSPAR) tablet 7.5 mg  7.5 mg Oral TID Oralia Manis, MD   7.5 mg at 06/02/17 1019  . carvedilol (COREG) tablet 3.125 mg  3.125 mg Oral BID WC Caroleen Hamman, FNP      . chlorpheniramine-HYDROcodone (TUSSIONEX) 10-8 MG/5ML suspension 5 mL  5 mL Oral Q12H Kalisetti, [place], MD      . citalopram (CELEXA) tablet 20 mg  20 mg Oral Daily Oralia Manis, MD   20 mg at 06/02/17 1019  . clonazePAM (KLONOPIN) tablet 0.25 mg  0.25 mg Oral BID PRN Enid Baas, MD   0.25 mg at 06/02/17 1419  . furosemide (LASIX) tablet 20 mg  20 mg Oral Daily Caroleen Hamman, FNP   20 mg at 06/02/17 1421  . heparin injection 5,000 Units  5,000 Units Subcutaneous Tedra Coupe Oralia Manis, MD   5,000 Units at 06/02/17  1420  . HYDROcodone-acetaminophen (NORCO/VICODIN) 5-325 MG per tablet 1 tablet  1 tablet Oral Q4H PRN Oralia Manis, MD   1 tablet at 06/02/17 1023  . ipratropium-albuterol (DUONEB) 0.5-2.5 (3) MG/3ML nebulizer solution 3 mL  3 mL Nebulization TID Enid Baas, MD      . MEDLINE mouth rinse  15 mL Mouth Rinse BID Oralia Manis, MD      . methylPREDNISolone sodium succinate (SOLU-MEDROL) 125 mg/2 mL injection 60 mg  60 mg Intravenous Q6H Oralia Manis, MD   60 mg at 06/02/17 1216  . mometasone-formoterol (DULERA) 200-5 MCG/ACT inhaler 2 puff  2 puff Inhalation BID Enid Baas, MD      . ondansetron [facility]) tablet 4 mg  4 mg Oral Q6H PRN Oralia Manis, MD    4 mg at 06/02/17 1101   Or  . ondansetron (ZOFRAN) injection 4 mg  4 mg Intravenous Q6H PRN Oralia Manis, MD      . pantoprazole (PROTONIX) EC tablet 40 mg  40 mg Oral Daily Oralia Manis, MD   40 mg at 06/02/17 1019  . roflumilast (DALIRESP) tablet 500 mcg  500 mcg Oral Daily Oralia Manis, MD   500 mcg at 06/02/17 1020  . sacubitril-valsartan (ENTRESTO) 24-26 mg per tablet  1 tablet Oral BID Caroleen Hamman, FNP   1 tablet at 06/02/17 1420     Discharge Medications: Please see discharge summary for a list of discharge medications.  Relevant Imaging Results:  Relevant Lab Results:   Additional Information SS#[id number]  Judi Cong, LCSW

## 2017-06-02 NOTE — Clinical Social Work Note (Signed)
Clinical Social Work Assessment  Patient Details  Name: Erica [name] MRN: [id number] Date of Birth: 10-Apr-1941  Date of referral:  06/02/17               Reason for consult:  Facility Placement                Permission sought to share information with:  Chartered certified accountant granted to share information::  Yes, Verbal Permission Granted  Name::        Agency::  [facility]  Relationship::     Contact Information:     Housing/Transportation Living arrangements for the past 2 months:  [place] of Information:  Patient, Scientist, water quality, Facility Patient Interpreter Needed:  None Criminal Activity/Legal Involvement Pertinent to Current Situation/Hospitalization:  No - Comment as needed Significant Relationships:  Adult Children, Warehouse manager, Other Family Members Lives with:  Facility Resident Do you feel safe going back to the place where you live?  Yes Need for family participation in patient care:  No (Coment)  Care giving concerns:  Patient admitted from [facility]    Social Worker assessment / plan:  CSW received a verbal consult from the patient's RN noting that the patient admitted from [facility]. According to [facility], she is a Copy resident who is in the process of becoming a LTC resident. The facility reports that she can return when stable and is chronic with o2, baseline 3L with [place].  The CSW met with the patient and multiple family members at bedside to discuss discharge planning. The patient stated that she plans to return to [facility] and feels that she receives adequate care. Her family members concurred. The plan is for the patient to discharge back to the facility on Monday.    Employment status:  Retired Nurse, adult PT Recommendations:  Not assessed at this time Information / Referral to community resources:     Patient/Family's Response to care:  The patient  and her family thanked the CSW for care  Patient/Family's Understanding of and Emotional Response to Diagnosis, Current Treatment, and Prognosis: The patient is aware of the discharge plan back to her originating SNF and is in agreement.  Emotional Assessment Appearance:  Appears stated age Attitude/Demeanor/Rapport:   (Pleasant and alert) Affect (typically observed):  Appropriate, Pleasant Orientation:  Oriented to Self, Oriented to Place, Oriented to  Time, Oriented to Situation Alcohol / Substance use:  Never Used Psych involvement (Current and /or in the community):  No (Comment)  Discharge Needs  Concerns to be addressed:  Care Coordination, Discharge Planning Concerns Readmission within the last 30 days:  No Current discharge risk:  Chronically ill Barriers to Discharge:  Continued Medical Work up   [employer], LCSW 06/02/2017, 3:55 PM

## 2017-06-02 NOTE — Clinical Social Work Note (Signed)
CSW contacted [facility] and requested that they contact the patient's RN for medication reconciliation. The patient can return when stable. CSW will complete full assessment when able.  Erica [name], MSW, Theresia Majors [phone]

## 2017-06-02 NOTE — Consult Note (Signed)
Erica [name] is a 76 y.o. female  [id number]  Primary Cardiologist: Dr. Adrian Blackwater Reason for Consultation:   HPI: 76yo female with PMH of chronic CHF, chronic kidney disease, COPD, hypertension, and hyperlipidemia presented to the ED with shortness of breath, hypoxia, and required BiPAP. She was weaned off iPAP and in the ED and admitted for COPD exacerbation as well as possible CHF exacerbation. BNP noted to be 658 with unknown baseline.    Review of Systems: Denies chest pain but reports shortness of breath that "comes and goes."   Past Medical History:  Diagnosis Date  . Chronic combined systolic and diastolic CHF (congestive heart failure) (HCC)   . CKD (chronic kidney disease)   . COPD (chronic obstructive pulmonary disease) (HCC)   . HLD (hyperlipidemia)   . HTN (hypertension)     Medications Prior to Admission  Medication Sig Dispense Refill  . allopurinol (ZYLOPRIM) 100 MG tablet Take 100 mg by mouth daily.    . busPIRone (BUSPAR) 7.5 MG tablet Take 7.5 mg by mouth 3 (three) times daily.    . citalopram (CELEXA) 20 MG tablet Take 20 mg by mouth daily.    . fluticasone (FLONASE) 50 MCG/ACT nasal spray Place 2 sprays into both nostrils daily.    Marland Kitchen lidocaine (XYLOCAINE) 5 % ointment Apply 1 application topically daily as needed.    . metolazone (ZAROXOLYN) 2.5 MG tablet Take 2.5 mg by mouth daily.    . mirtazapine (REMERON) 15 MG tablet Take 15 mg by mouth at bedtime.    Marland Kitchen omeprazole (PRILOSEC) 40 MG capsule Take 40 mg by mouth daily.    Marland Kitchen oxyCODONE (OXY IR/ROXICODONE) 5 MG immediate release tablet Take 5 mg by mouth every 4 (four) hours as needed for severe pain.    . predniSONE (DELTASONE) 1 MG tablet Take 2 mg by mouth daily with breakfast.    . roflumilast (DALIRESP) 500 MCG TABS tablet Take 500 mcg by mouth daily.    Marland Kitchen spironolactone (ALDACTONE) 25 MG tablet Take 25 mg by mouth daily.    Marland Kitchen tiotropium (SPIRIVA) 18 MCG inhalation capsule Place 18 mcg into inhaler  and inhale daily.       Marland Kitchen azithromycin  500 mg Oral Daily  . busPIRone  7.5 mg Oral TID  . citalopram  20 mg Oral Daily  . heparin  5,000 Units Subcutaneous Q8H  . mouth rinse  15 mL Mouth Rinse BID  . methylPREDNISolone (SOLU-MEDROL) injection  60 mg Intravenous Q6H  . pantoprazole  40 mg Oral Daily  . roflumilast  500 mcg Oral Daily  . spironolactone  25 mg Oral Daily  . tiotropium  18 mcg Inhalation Daily    Infusions:   No Known Allergies  Social History   Social History  . Marital status: Unknown    Spouse name: N/A  . Number of children: N/A  . Years of education: N/A   Occupational History  . Not on file.   Social History Main Topics  . Smoking status: Former Games developer  . Smokeless tobacco: Never Used  . Alcohol use No  . Drug use: No  . Sexual activity: Not on file   Other Topics Concern  . Not on file   Social History Narrative  . No narrative on file    Family History  Problem Relation Age of Onset  . Cancer Mother   . Hypertension Mother   . Heart disease Sister     PHYSICAL EXAM: Vitals:  06/02/17 0025 06/02/17 0801  BP: 139/77 120/68  Pulse: (!) 117 97  Resp: 19 (!) 22  Temp: 98.5 F (36.9 C) 98.4 F (36.9 C)  SpO2: 100% 100%     Intake/Output Summary (Last 24 hours) at 06/02/17 1001 Last data filed at 06/02/17 0523  Gross per 24 hour  Intake                0 ml  Output                0 ml  Net                0 ml    General:  Lethargic, increased respiratory effort noted. HEENT: normal Neck: supple. no JVD. Carotids 2+ bilat; no bruits. No lymphadenopathy or thryomegaly appreciated. Cor: PMI nondisplaced. Regular rate & rhythm. No rubs, gallops or murmurs. Lungs: Wheezing noted bilaterally Abdomen: soft, nontender, nondistended. No hepatosplenomegaly. No bruits or masses. Good bowel sounds. Extremities: no cyanosis, clubbing, rash, edema Neuro: alert & oriented x 3, cranial nerves grossly intact. moves all 4 extremities w/o  difficulty. Affect pleasant.  ECG: NSR 97bpm  Results for orders placed or performed during the hospital encounter of 06/01/17 (from the past 24 hour(s))  Basic metabolic panel     Status: Abnormal   Collection Time: 06/01/17  7:16 PM  Result Value Ref Range   Sodium 141 135 - 145 mmol/L   Potassium 5.2 (H) 3.5 - 5.1 mmol/L   Chloride 105 101 - 111 mmol/L   CO2 25 22 - 32 mmol/L   Glucose, Bld 142 (H) 65 - 99 mg/dL   BUN 36 (H) 6 - 20 mg/dL   Creatinine, Ser 1.61 (H) 0.44 - 1.00 mg/dL   Calcium 9.5 8.9 - 09.6 mg/dL   GFR calc non Af Amer 26 (L) >60 mL/min   GFR calc Af Amer 30 (L) >60 mL/min   Anion gap 11 5 - 15  CBC with Differential     Status: Abnormal   Collection Time: 06/01/17  7:16 PM  Result Value Ref Range   WBC 10.2 3.6 - 11.0 K/uL   RBC 3.27 (L) 3.80 - 5.20 MIL/uL   Hemoglobin 8.7 (L) 12.0 - 16.0 g/dL   HCT 04.5 (L) 40.9 - 81.1 %   MCV 82.3 80.0 - 100.0 fL   MCH 26.6 26.0 - 34.0 pg   MCHC 32.3 32.0 - 36.0 g/dL   RDW 91.4 (H) 78.2 - 95.6 %   Platelets 282 150 - 440 K/uL   Neutrophils Relative % 77 %   Neutro Abs 7.9 (H) 1.4 - 6.5 K/uL   Lymphocytes Relative 11 %   Lymphs Abs 1.1 1.0 - 3.6 K/uL   Monocytes Relative 8 %   Monocytes Absolute 0.8 0.2 - 0.9 K/uL   Eosinophils Relative 3 %   Eosinophils Absolute 0.3 0 - 0.7 K/uL   Basophils Relative 1 %   Basophils Absolute 0.1 0 - 0.1 K/uL  Protime-INR     Status: None   Collection Time: 06/01/17  7:16 PM  Result Value Ref Range   Prothrombin Time 13.4 11.4 - 15.2 seconds   INR 1.03   Brain natriuretic peptide     Status: Abnormal   Collection Time: 06/01/17  7:16 PM  Result Value Ref Range   B Natriuretic Peptide 656.0 (H) 0.0 - 100.0 pg/mL  Troponin I     Status: Abnormal   Collection Time: 06/01/17  7:16 PM  Result  Value Ref Range   Troponin I 0.05 (HH) <0.03 ng/mL  Blood gas, venous     Status: Abnormal   Collection Time: 06/01/17  7:16 PM  Result Value Ref Range   FIO2 1.00    Delivery systems  VENTILATOR    pH, Ven 7.35 7.250 - 7.430   pCO2, Ven 52 44.0 - 60.0 mmHg   pO2, Ven 290.0 (H) 32.0 - 45.0 mmHg   Bicarbonate 28.7 (H) 20.0 - 28.0 mmol/L   Acid-Base Excess 2.6 (H) 0.0 - 2.0 mmol/L   O2 Saturation 99.9 %   Patient temperature 37.0    Collection site RIGHT RADIAL    Sample type ARTERIAL DRAW   Troponin I     Status: Abnormal   Collection Time: 06/02/17 12:58 AM  Result Value Ref Range   Troponin I 0.04 (HH) <0.03 ng/mL  MRSA PCR Screening     Status: None   Collection Time: 06/02/17  5:18 AM  Result Value Ref Range   MRSA by PCR NEGATIVE NEGATIVE  Troponin I     Status: Abnormal   Collection Time: 06/02/17  6:19 AM  Result Value Ref Range   Troponin I 0.03 (HH) <0.03 ng/mL  Basic metabolic panel     Status: Abnormal   Collection Time: 06/02/17  6:19 AM  Result Value Ref Range   Sodium 139 135 - 145 mmol/L   Potassium 4.9 3.5 - 5.1 mmol/L   Chloride 105 101 - 111 mmol/L   CO2 27 22 - 32 mmol/L   Glucose, Bld 161 (H) 65 - 99 mg/dL   BUN 37 (H) 6 - 20 mg/dL   Creatinine, Ser 1.61 (H) 0.44 - 1.00 mg/dL   Calcium 9.0 8.9 - 09.6 mg/dL   GFR calc non Af Amer 28 (L) >60 mL/min   GFR calc Af Amer 33 (L) >60 mL/min   Anion gap 7 5 - 15  CBC     Status: Abnormal   Collection Time: 06/02/17  6:19 AM  Result Value Ref Range   WBC 5.0 3.6 - 11.0 K/uL   RBC 3.11 (L) 3.80 - 5.20 MIL/uL   Hemoglobin 8.3 (L) 12.0 - 16.0 g/dL   HCT 04.5 (L) 40.9 - 81.1 %   MCV 82.3 80.0 - 100.0 fL   MCH 26.7 26.0 - 34.0 pg   MCHC 32.5 32.0 - 36.0 g/dL   RDW 91.4 (H) 78.2 - 95.6 %   Platelets 246 150 - 440 K/uL  Glucose, capillary     Status: Abnormal   Collection Time: 06/02/17  7:31 AM  Result Value Ref Range   Glucose-Capillary 141 (H) 65 - 99 mg/dL   Comment 1 Notify RN    Dg Chest Portable 1 View  Result Date: 06/01/2017 CLINICAL DATA:  Shortness of breath EXAM: PORTABLE CHEST 1 VIEW COMPARISON:  None. FINDINGS: Hyperinflation with emphysematous changes. No acute consolidation.  Linear scarring or atelectasis at the bases. No pleural effusion. Borderline cardiomegaly. Aortic atherosclerosis. No pneumothorax. IMPRESSION: Hyperinflation with emphysematous changes. Linear scarring or atelectasis at both bases Electronically Signed   By: Jasmine Pang M.D.   On: 06/01/2017 19:43     ASSESSMENT AND PLAN: CHF/COPD exacerbation with severe left ventricular dysfunction noted on echo, EF of 24%. Mild troponin elevation likely due to demand ischemia. Will start low doses of Coreg, Entresto, and lasix. Mild hyperkalemia noted, started lasix  stop aldactone due to CKD and low GFR. Continue to monitor blood pressure and potassium status. Will continue  to follow.       Caroleen Hamman

## 2017-06-02 NOTE — Progress Notes (Signed)
*  PRELIMINARY RESULTS* Echocardiogram 2D Echocardiogram has been performed.  Garrel Ridgel Isa Hitz 06/02/2017, 10:15 AM

## 2017-06-02 NOTE — ED Notes (Signed)
Pt transport to 151 

## 2017-06-03 ENCOUNTER — Inpatient Hospital Stay: Payer: Medicare Other

## 2017-06-03 DIAGNOSIS — J441 Chronic obstructive pulmonary disease with (acute) exacerbation: Secondary | ICD-10-CM

## 2017-06-03 LAB — BLOOD GAS, ARTERIAL
Acid-Base Excess: 0.3 mmol/L (ref 0.0–2.0)
BICARBONATE: 26.8 mmol/L (ref 20.0–28.0)
Delivery systems: POSITIVE
EXPIRATORY PAP: 5
FIO2: 0.28
Inspiratory PAP: 12
O2 SAT: 94.5 %
PATIENT TEMPERATURE: 37
PH ART: 7.32 — AB (ref 7.350–7.450)
PO2 ART: 79 mmHg — AB (ref 83.0–108.0)
RATE: 8 resp/min
pCO2 arterial: 52 mmHg — ABNORMAL HIGH (ref 32.0–48.0)

## 2017-06-03 LAB — GLUCOSE, CAPILLARY: Glucose-Capillary: 151 mg/dL — ABNORMAL HIGH (ref 65–99)

## 2017-06-03 MED ORDER — LORAZEPAM 2 MG/ML IJ SOLN
0.5000 mg | Freq: Once | INTRAMUSCULAR | Status: AC
  Administered 2017-06-03: 0.5 mg via INTRAVENOUS

## 2017-06-03 MED ORDER — LORAZEPAM 2 MG/ML IJ SOLN
INTRAMUSCULAR | Status: AC
  Administered 2017-06-03: 0.5 mg
  Filled 2017-06-03: qty 1

## 2017-06-03 MED ORDER — LORAZEPAM 2 MG/ML IJ SOLN
INTRAMUSCULAR | Status: AC
  Filled 2017-06-03: qty 1

## 2017-06-03 MED ORDER — LORAZEPAM 2 MG/ML IJ SOLN
1.0000 mg | Freq: Once | INTRAMUSCULAR | Status: AC
Start: 2017-06-03 — End: 2017-06-03
  Administered 2017-06-03: 1 mg via INTRAVENOUS
  Filled 2017-06-03: qty 1

## 2017-06-03 MED ORDER — METOPROLOL TARTRATE 5 MG/5ML IV SOLN
5.0000 mg | Freq: Once | INTRAVENOUS | Status: AC
  Administered 2017-06-03: 5 mg via INTRAVENOUS
  Filled 2017-06-03: qty 5

## 2017-06-03 MED ORDER — IPRATROPIUM-ALBUTEROL 0.5-2.5 (3) MG/3ML IN SOLN
3.0000 mL | Freq: Four times a day (QID) | RESPIRATORY_TRACT | Status: DC
  Administered 2017-06-03 – 2017-06-06 (×14): 3 mL via RESPIRATORY_TRACT
  Filled 2017-06-03 (×14): qty 3

## 2017-06-03 MED ORDER — DEXMEDETOMIDINE HCL IN NACL 400 MCG/100ML IV SOLN
0.4000 ug/kg/h | INTRAVENOUS | Status: DC
  Administered 2017-06-03: 0.4 ug/kg/h via INTRAVENOUS
  Administered 2017-06-04 – 2017-06-05 (×7): 1.2 ug/kg/h via INTRAVENOUS
  Administered 2017-06-05: 1 ug/kg/h via INTRAVENOUS
  Administered 2017-06-06: 1.2 ug/kg/h via INTRAVENOUS
  Administered 2017-06-06: 0.7 ug/kg/h via INTRAVENOUS
  Filled 2017-06-03: qty 200
  Filled 2017-06-03 (×7): qty 100
  Filled 2017-06-03: qty 200

## 2017-06-03 MED ORDER — FUROSEMIDE 10 MG/ML IJ SOLN
INTRAMUSCULAR | Status: AC
  Filled 2017-06-03: qty 4

## 2017-06-03 MED ORDER — METHYLPREDNISOLONE SODIUM SUCC 125 MG IJ SOLR
60.0000 mg | INTRAMUSCULAR | Status: AC
  Administered 2017-06-03: 60 mg via INTRAVENOUS

## 2017-06-03 MED ORDER — FUROSEMIDE 10 MG/ML IJ SOLN
40.0000 mg | INTRAMUSCULAR | Status: AC
  Administered 2017-06-03: 40 mg via INTRAVENOUS
  Filled 2017-06-03: qty 4

## 2017-06-03 MED ORDER — DILTIAZEM HCL 100 MG IV SOLR
5.0000 mg/h | INTRAVENOUS | Status: DC
  Administered 2017-06-03 – 2017-06-05 (×5): 5 mg/h via INTRAVENOUS
  Filled 2017-06-03 (×4): qty 100

## 2017-06-03 MED ORDER — LORAZEPAM 2 MG/ML IJ SOLN
1.0000 mg | INTRAMUSCULAR | Status: AC
  Administered 2017-06-03: 1 mg via INTRAVENOUS

## 2017-06-03 MED ORDER — ALBUTEROL SULFATE (2.5 MG/3ML) 0.083% IN NEBU
2.5000 mg | INHALATION_SOLUTION | RESPIRATORY_TRACT | Status: DC | PRN
  Administered 2017-06-03 (×2): 2.5 mg via RESPIRATORY_TRACT
  Filled 2017-06-03 (×3): qty 3

## 2017-06-03 MED ORDER — LORAZEPAM 2 MG/ML IJ SOLN
INTRAMUSCULAR | Status: AC
  Administered 2017-06-03: 1 mg via INTRAVENOUS
  Filled 2017-06-03: qty 1

## 2017-06-03 NOTE — Progress Notes (Signed)
Came to ICU this am as  Rapid  Response. Had similar episode this eve at 1720. After getting on and off bedpan for bm. Pt became very labored, anxious, panicky, "can't breathe", diaphoretic.BP and HR elevated. Was already on bipap prior to episode.  Precedex gtt, ativan, solumedrol, nebulizer tx, and eventually cardizem gtt at /hr started. Dr Lonn Georgia was at bedside. Currently she is resting on 28% bipap. Precedex gtt at 0.3. She wakes up easily and is no longer labored. Family here and aware of events.

## 2017-06-03 NOTE — Progress Notes (Signed)
Improved from this am.  Able to take short breaks off bipap for sips and meds.

## 2017-06-03 NOTE — Progress Notes (Signed)
During AM rounds pt bed noted to be wet, external cath displacedPt became very anxious, diaphoretic, SOB with activity, worsening wheezes. VS checked, HR in 150's, O2 @ at 4L at 99%. Pt states she "feels like she is going to die!"  Rapid Response called, pts HR 164,O2 92, increased to nasal cannula @ 6L BP, reading 202/117, may not be accurate due to pt movement.  9629: RRT, CCU nurse, Huntley Dec and RN Loraine Grip at bedside. Dr Nemiah Commander paged and responded.  New orders placed, pt transferred to ICU RM 12. Family notified by Dr. Nemiah Commander.

## 2017-06-03 NOTE — Progress Notes (Signed)
CH received page for rapid response request from previous on-call CH. CH arrived to patients room to find healthcare team with patient. CH asked about family; no family present. CH provided silent prayer outside patients room and is available if further services are needed.    06/03/17 5409  Clinical Encounter Type  Visited With Patient not available;Health care provider  Visit Type Initial;Spiritual support;Code  Referral From Nurse

## 2017-06-03 NOTE — Consult Note (Signed)
[facility] Consultation    ASSESSMENT/PLAN   Respiratory distress. Acute hypercapnic respiratory failure on NIV, Solu-Medrol albuterol, Atrovent and azithromycin. We'll continue to monitor closely, on Precedex and benzodiazepine for anxiety. If respiratory status worsens will proceed with intubation and mechanical ventilation.  Heart failure both systolic and diastolic. Patient is on Lasix and Entresto. Believe patient's respiratory failure is more consistent with COPD than heart failure at this time.Chest x-ray shows both hyperinflated lung fields with cardiomegaly  Sepsis. Blood cultures are consistent with methicillin-resistant staph aureus. We'll add vancomycin  Renal insufficiency. Will follow closely  Anemia.No evidence of active bleeding   FiO2 (%):  [28 %] 28 %  INTAKE / OUTPUT:  Intake/Output Summary (Last 24 hours) at 06/03/17 0942 Last data filed at 06/03/17 0820  Gross per 24 hour  Intake              120 ml  Output             1150 ml  Net            -1030 ml   Name: Erica [name] MRN: [id number] DOB: [birth year]    ADMISSION DATE:  06/01/2017 CONSULTATION DATE:  06/03/2017  REFERRING MD :  hospitalist service  CHIEF COMPLAINT:  progressive shortness of breath   HISTORY OF PRESENT ILLNESS:  Erica [name] is a 76 year old African-American female with a past medical history remarkable for hypertension, hyperlipidemia, chronic renal insufficiency,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 She was subsequently moved to the intensive care unit and started on noninvasive ventilation. She was given benzodiazepine and is now being started on Precedex for air hunger. Presently she states she is feeling better, on noninvasive ventilation,still with labored breathing respiratory rate of 32 with accessory muscle  utilization.  PAST MEDICAL HISTORY :  Past Medical History:  Diagnosis Date  . Chronic combined systolic and diastolic CHF (congestive heart failure) (HCC)   . CKD (chronic kidney disease)   . COPD (chronic obstructive pulmonary disease) (HCC)   . HLD (hyperlipidemia)   . HTN (hypertension)    Past Surgical History:  Procedure Laterality Date  . APPENDECTOMY     Prior to Admission medications   Medication Sig Start Date End Date Taking? Authorizing Provider  allopurinol (ZYLOPRIM) 100 MG tablet Take 100 mg by mouth daily.   Yes [provider]  busPIRone (BUSPAR) 7.5 MG tablet Take 7.5 mg by mouth 3 (three) times daily.   Yes [provider]  citalopram (CELEXA) 20 MG tablet Take 20 mg by mouth daily.   Yes [provider]  fluticasone (FLONASE) 50 MCG/ACT nasal spray Place 2 sprays into both nostrils daily.   Yes [provider]  lidocaine (XYLOCAINE) 5 % ointment Apply 1 application topically daily as needed.   Yes [provider]  metolazone (ZAROXOLYN) 2.5 MG tablet Take 2.5 mg by mouth daily.   Yes [provider]  mirtazapine (REMERON) 15 MG tablet Take 15 mg by mouth at bedtime.   Yes [provider]  omeprazole (PRILOSEC) 40 MG capsule Take 40 mg by mouth daily.   Yes [provider]  oxyCODONE (OXY IR/ROXICODONE) 5 MG immediate release tablet Take 5 mg by mouth every 4 (four) hours as needed for severe pain.   Yes [provider]  predniSONE (DELTASONE) 1 MG tablet Take 2 mg by mouth daily with breakfast.  Yes [provider]  roflumilast (DALIRESP) 500 MCG TABS tablet Take 500 mcg by mouth daily.   Yes [provider]  spironolactone (ALDACTONE) 25 MG tablet Take 25 mg by mouth daily.   Yes [provider]  tiotropium (SPIRIVA) 18 MCG inhalation capsule Place 18 mcg into inhaler and inhale daily.   Yes [provider]   No Known Allergies  FAMILY HISTORY:   Family History  Problem Relation Age of Onset  . Cancer Mother   . Hypertension Mother   . Heart disease Sister    SOCIAL HISTORY:  reports that she has quit smoking. She has never used smokeless tobacco. She reports that she does not drink alcohol or use drugs.  REVIEW OF SYSTEMS:    The remainder of systems were reviewed and were found to be negative other than what is documented in the HPI.    VITAL SIGNS: Temp:  [97.8 F (36.6 C)-98.5 F (36.9 C)] 97.9 F (36.6 C) (10/07 0930) Pulse Rate:  [77-165] 93 (10/07 0938) Resp:  [20-25] 25 (10/07 0938) BP: (105-190)/(58-123) 121/79 (10/07 0938) SpO2:  [96 %-100 %] 96 % (10/07 0938) FiO2 (%):  [28 %] 28 % (10/07 0938) Weight:  [65.4 kg (144 lb 2.9 oz)-68.5 kg (151 lb)] 65.4 kg (144 lb 2.9 oz) (10/07 0930) HEMODYNAMICS:   VENTILATOR SETTINGS: FiO2 (%):  [28 %] 28 % INTAKE / OUTPUT:  Intake/Output Summary (Last 24 hours) at 06/03/17 0942 Last data filed at 06/03/17 0820  Gross per 24 hour  Intake              120 ml  Output             1150 ml  Net            -1030 ml    Physical Examination:   VS: BP 121/79   Pulse 93   Temp 97.9 F (36.6 C) (Axillary)   Resp (!) 25   Ht  (1.651 m)   Wt 65.4 kg (144 lb 2.9 oz)   SpO2 96%   BMI 23.99 kg/m   General Appearance: patient in respiratory distress on noninvasive ventilation in the intensive care unitdistress  Neuro:Limited exam, no obvious focal deficits HEENT: PERRLA, EOM intact, no ptosis, no other lesions noticed;  Pulmonary: diffuse wheezing noted, prolonged expiratory phase clear bronchospasm appreciated Cardiovascular sinus tachycardia appreciated Abdomen: Benign, Soft, non-tender, No masses, hepatosplenomegaly, No lymphadenopathy Endoc: No evident thyromegaly, no signs of acromegaly. Skin:   warm, no rashes, no ecchymosis  Extremities: normal, no cyanosis, clubbing, no edema, warm with normal capillary refill.    LABS: Reviewed   LABORATORY PANEL:    CBC  Recent Labs Lab 06/02/17 0619  WBC 5.0  HGB 8.3*  HCT 25.6*  PLT 246    Chemistries   Recent Labs Lab 06/02/17 0619  NA 139  K 4.9  CL 105  CO2 27  GLUCOSE 161*  BUN 37*  CREATININE 1.70*  CALCIUM 9.0     Recent Labs Lab 06/02/17 0731  GLUCAP 141*    Recent Labs Lab 06/03/17 0917  PHART 7.32*  PCO2ART 52*  PO2ART 79*   No results for input(s): AST, ALT, ALKPHOS, BILITOT, ALBUMIN in the last 168 hours.  Cardiac Enzymes  Recent Labs Lab 06/02/17 0619  TROPONINI 0.03*    RADIOLOGY:  Dg Chest Port 1 View  Result Date: 06/03/2017 CLINICAL DATA:  Coughing, weakness, diaphoretic, dyspnea EXAM: PORTABLE CHEST 1 VIEW COMPARISON:  06/01/2017 FINDINGS:  Cardiac silhouette is borderline enlarged. No mediastinal or hilar masses. No convincing adenopathy. Lungs are hyperexpanded. Stable scarring in the lung bases. No evidence of pneumonia or pulmonary edema. No pleural effusion or pneumothorax. Skeletal structures are grossly intact. IMPRESSION: 1. No acute cardiopulmonary disease. 2. COPD. Electronically Signed   By: Amie Portland M.D.   On: 06/03/2017 09:11   Dg Chest Portable 1 View  Result Date: 06/01/2017 CLINICAL DATA:  Shortness of breath EXAM: PORTABLE CHEST 1 VIEW COMPARISON:  None. FINDINGS: Hyperinflation with emphysematous changes. No acute consolidation. Linear scarring or atelectasis at the bases. No pleural effusion. Borderline cardiomegaly. Aortic atherosclerosis. No pneumothorax. IMPRESSION: Hyperinflation with emphysematous changes. Linear scarring or atelectasis at both bases Electronically Signed   By: Jasmine Pang M.D.   On: 06/01/2017 19:43     Tora Kindred, DO 06/03/2017, 9:42 AM

## 2017-06-03 NOTE — Significant Event (Addendum)
Rapid Response Event Note  Overview: Time Called: 0822 Event Type: Respiratory, Cardiac  Initial Focused Assessment: RRT RN arrived in room and patient was sitting at 90 degrees on the edge of the bed being supported physically by patient's RN. Patient with increased work of breathing, accessory muscle use, diaphoretic, and patient's RN coaching patient through pursed lip breathing. Initial vital signs: HR 150-160s SVT with ST depression, but rhythm hard to determine on monitor due to patient's movements, 100% O2 sats on 6 L nasal cannula, BP 202/115 (128), RR 25. Patient highly anxious but still alert and oriented and following commands. Patient's RN reports that after patient rolled once to each side to change bed linens patient became tired and diaphoretic and short of breath so vital signs obtained, MD paged, and Rapid Response Team called. Yesterday after patient admitted, patient was dyspneic with exertion but not to this extent per report that had been received from yesterday's primary RN. Patient on chronic 2 L nasal cannula at baseline, had been on 4 L nasal cannula prior to this incident. After this incident started RN increased patient's oxygen to 6 L.   Interventions: 12 lead EKG obtained. Scheduled duo-neb adminstered by respiratory, lungs still with bilateral wheezes but slightly improved. Cardiology NP present at bedside and reviewed 12 lead with Rapid Response Team, ordered 5 mg of metoprolol IV once. Vital signs post administration: HR 138 ST, 100% O2 sats on 6 L nasal cannula, RR 25 (still with accessory muscle use and increased work of breathing), BP 195/119 (143). IV ativan, solu-medrol, and lasix given per MD orders. Post administration HR 112 ST, 99 % O2 on 6L, RR 22 (work of breathing decreased but still elevated, accessory muscle use less, patient still with bilateral wheezes with no improvement from previous assessment), BP 190/115 (140). Patient calmer and less anxious and able to  moved into bed (though bed still in high fowler's position).   Plan of Care (if not transferred): Transferred to ICU 12. Patient to be placed on bipap upon arrival. Dr. Nemiah Commander to call Dr. Lonn Georgia (intensivist on call) and patient's daughter.  Event Summary: Name of Physician Notified: Dr. Nemiah Commander at 0830  Name of Consulting Physician Notified: Mora Appl NP (cardiology, already consulted, happened to be walking by) at 0822  Outcome: Transferred (Comment), Stayed in room and stabalized (ICU 12)  Event End Time: 0930  Christell Constant [facility] [place]

## 2017-06-03 NOTE — Progress Notes (Signed)
[facility] [place] [facility]   PATIENT NAME: Erica [name]    MR#:  [id number]  DATE OF BIRTH:  Aug 06, 1941  SUBJECTIVE:  CHIEF COMPLAINT:   Chief Complaint  Patient presents with  . Respiratory Distress   - Patient had a rapid response this morning as patient care was being undertaken, just rolling over in bed and caused her to have an acute respiratory distress episode with tachycardia and heart rate in the 180s persistently. -Currently being placed on BiPAP and moved to ICU  REVIEW OF SYSTEMS:  Review of Systems  Constitutional: Positive for malaise/fatigue. Negative for chills and fever.  HENT: Negative for congestion, ear discharge, hearing loss and nosebleeds.   Eyes: Negative for blurred vision.  Respiratory: Positive for cough, sputum production and shortness of breath. Negative for wheezing.   Cardiovascular: Positive for palpitations. Negative for chest pain and leg swelling.  Gastrointestinal: Positive for nausea and vomiting. Negative for abdominal pain, constipation and diarrhea.  Genitourinary: Negative for dysuria and urgency.  Musculoskeletal: Positive for myalgias.  Neurological: Negative for dizziness, speech change, focal weakness, seizures and headaches.  Psychiatric/Behavioral: Negative for depression. The patient is nervous/anxious.     DRUG ALLERGIES:  No Known Allergies  VITALS:  Blood pressure 121/79, pulse 93, temperature 97.9 F (36.6 C), temperature source Axillary, resp. rate (!) 25, height  (1.651 m), weight 65.4 kg (144 lb 2.9 oz), SpO2 96 %.  PHYSICAL EXAMINATION:  Physical Exam  GENERAL:  76 y.o.-year-old patient Sitting by the side of bed, appears to be in distress, diaphoretic and tachycardic.Marland Kitchen  EYES: Pupils equal, round, reactive to light and accommodation. No scleral icterus. Extraocular muscles intact.  HEENT: Head atraumatic, normocephalic. Oropharynx and nasopharynx clear.  NECK:  Supple, no jugular  venous distention. No thyroid enlargement, no tenderness.  LUNGS: Scant breath sounds with diffuse coarse wheezing all over the lung fields, no rhonchi or rails. Using accessory muscles to breathe CARDIOVASCULAR: S1, S2 normal. No rubs, or gallops. 2/6 systolic murmur present. ABDOMEN: Soft, nontender, nondistended. Bowel sounds present. No organomegaly or mass.  EXTREMITIES: No pedal edema, cyanosis, or clubbing.  NEUROLOGIC: Cranial nerves II through XII are intact. Muscle strength 5/5 in all extremities. Sensation intact. Gait not checked. Global weakness noted. PSYCHIATRIC: The patient is alert and oriented x 3.  SKIN: No obvious rash, lesion, or ulcer.    LABORATORY PANEL:   CBC  Recent Labs Lab 06/02/17 0619  WBC 5.0  HGB 8.3*  HCT 25.6*  PLT 246   ------------------------------------------------------------------------------------------------------------------  Chemistries   Recent Labs Lab 06/02/17 0619  NA 139  K 4.9  CL 105  CO2 27  GLUCOSE 161*  BUN 37*  CREATININE 1.70*  CALCIUM 9.0   ------------------------------------------------------------------------------------------------------------------  Cardiac Enzymes  Recent Labs Lab 06/02/17 0619  TROPONINI 0.03*   ------------------------------------------------------------------------------------------------------------------  RADIOLOGY:  Dg Chest Port 1 View  Result Date: 06/03/2017 CLINICAL DATA:  Coughing, weakness, diaphoretic, dyspnea EXAM: PORTABLE CHEST 1 VIEW COMPARISON:  06/01/2017 FINDINGS: Cardiac silhouette is borderline enlarged. No mediastinal or hilar masses. No convincing adenopathy. Lungs are hyperexpanded. Stable scarring in the lung bases. No evidence of pneumonia or pulmonary edema. No pleural effusion or pneumothorax. Skeletal structures are grossly intact. IMPRESSION: 1. No acute cardiopulmonary disease. 2. COPD. Electronically Signed   By: Amie Portland M.D.   On: 06/03/2017 09:11    Dg Chest Portable 1 View  Result Date: 06/01/2017 CLINICAL DATA:  Shortness of breath EXAM: PORTABLE CHEST 1 VIEW COMPARISON:  None. FINDINGS: Hyperinflation with emphysematous changes. No acute consolidation. Linear scarring or atelectasis at the bases. No pleural effusion. Borderline cardiomegaly. Aortic atherosclerosis. No pneumothorax. IMPRESSION: Hyperinflation with emphysematous changes. Linear scarring or atelectasis at both bases Electronically Signed   By: Jasmine Pang M.D.   On: 06/01/2017 19:43    EKG:   Orders placed or performed during the hospital encounter of 06/01/17  . EKG 12-Lead  . EKG 12-Lead  . EKG 12-Lead  . EKG 12-Lead    ASSESSMENT AND PLAN:   76 year old female with past medical history significant for chronic respiratory failure secondary to COPD and combined CHF, on 2 L home oxygen at rest, CK D and hypertension presents from [facility] nursing home secondary to worsening shortness of breath  #1 acute on chronic hypoxic respiratory failure-secondary to COPD exacerbation and CHF exacerbation. - Initially required BiPAP, however due to acute hypoxic respiratory distress episode this morning, being placed on BiPAP again -Patient will be moved to ICU -uses 2 L at the nursing home and on exertion uses up to 6 L at baseline -Continue steroids, cough medications. -Continue Lasix -On azithromycin , but antibiotics might be brought in. Blood cultures are negative on admission though BC ID showing MRSA positive - also has systolic heart failure, echo with EF of 25%. Appreciate cardiology consult. And entresto started  #2 depression and anxiety-continue home medications. 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 Avoid nephrotoxins and monitor  #5 DVT prophylaxis heparin subcutaneous heparin   Discussed with intensivist on call Dr. Lonn Georgia and also updated patient's oldest daughter Ms.  Angelique Blonder   All the records are reviewed and case discussed with Care Management/Social Workerr. Management plans discussed with the patient, family and they are in agreement.  CODE STATUS: Full Code  TOTAL CRITICAL CARE TIME SPENT IN TAKING CARE OF THIS PATIENT: 42 minutes.   POSSIBLE D/C IN 3-4 DAYS, DEPENDING ON CLINICAL CONDITION.   Exilda Wilhite M.D on 06/03/2017 at 10:19 AM  Between 7am to 6pm - Pager - [phone]  After 6pm go to [email or web address] - password [email or web address]  [facility]  Office  [phone]  CC: Primary care physician; System, Pcp Not In

## 2017-06-03 NOTE — Progress Notes (Signed)
SUBJECTIVE: Entered room to round on pt and the rapid response team was at bedside. Pt was shouting she could not get enough air and she appeared very anxious.  Vitals:   06/02/17 2054 06/03/17 0437 06/03/17 0500 06/03/17 0747  BP:    132/66  Pulse:    87  Resp:    20  Temp:    98.5 F (36.9 C)  TempSrc:    Oral  SpO2: 100% 97%  99%  Weight:   151 lb (68.5 kg)   Height:        Intake/Output Summary (Last 24 hours) at 06/03/17 0800 Last data filed at 06/03/17 0626  Gross per 24 hour  Intake              120 ml  Output              850 ml  Net             -730 ml    LABS: Basic Metabolic Panel:  Recent Labs  16/10/96 1916 06/02/17 0619  NA 141 139  K 5.2* 4.9  CL 105 105  CO2 25 27  GLUCOSE 142* 161*  BUN 36* 37*  CREATININE 1.86* 1.70*  CALCIUM 9.5 9.0   Liver Function Tests: No results for input(s): AST, ALT, ALKPHOS, BILITOT, PROT, ALBUMIN in the last 72 hours. No results for input(s): LIPASE, AMYLASE in the last 72 hours. CBC:  Recent Labs  06/01/17 1916 06/02/17 0619  WBC 10.2 5.0  NEUTROABS 7.9*  --   HGB 8.7* 8.3*  HCT 26.9* 25.6*  MCV 82.3 82.3  PLT 282 246   Cardiac Enzymes:  Recent Labs  06/01/17 1916 06/02/17 0058 06/02/17 0619  TROPONINI 0.05* 0.04* 0.03*   BNP: Invalid input(s): POCBNP D-Dimer: No results for input(s): DDIMER in the last 72 hours. Hemoglobin A1C: No results for input(s): HGBA1C in the last 72 hours. Fasting Lipid Panel: No results for input(s): CHOL, HDL, LDLCALC, TRIG, CHOLHDL, LDLDIRECT in the last 72 hours. Thyroid Function Tests: No results for input(s): TSH, T4TOTAL, T3FREE, THYROIDAB in the last 72 hours.  Invalid input(s): FREET3 Anemia Panel: No results for input(s): VITAMINB12, FOLATE, FERRITIN, TIBC, IRON, RETICCTPCT in the last 72 hours.   PHYSICAL EXAM General: Anxious, labored breathing, sweating perfusely HEENT:  Normocephalic and atramatic Neck:  No JVD.  Lungs: Wheezes bilaterally Heart:  Tachycardic Abdomen: Bowel sounds are positive, abdomen soft and non-tender  Msk:  Back normal, normal gait. Normal strength and tone for age. Extremities: No clubbing, cyanosis or edema.   Neuro: Anxious Psych:  Good affect, responds appropriately  TELEMETRY: SVT rate 167bpm, reduced to 120s with IV metoprolol  ASSESSMENT AND PLAN: CHF/COPD: Entered room for rounding and pt was shouting she could not get enough air and had labored breathing. Rapid response was called by RN. Sats were 100% on 5L, EKG showed SVT 167. Blood pressure 190s/100 Stat  IV metoprolol was given and Dr. Nemiah Commander was at bedside. Breathing treatments provided and with anxiolytics. Pt will be transferred to CCU for monitoring.   Principal Problem:   COPD with acute exacerbation (HCC) Active Problems:   HTN (hypertension)   HLD (hyperlipidemia)   Acute on chronic combined systolic and diastolic CHF (congestive heart failure) (HCC)   CKD (chronic kidney disease), stage IV (HCC)    Caroleen Hamman, NP-C 06/03/2017 8:00 AM

## 2017-06-03 NOTE — Progress Notes (Signed)
Rapid Response called, pts HR 164,O2 92 nasal 6 202/117. RRT team at bedside.

## 2017-06-04 DIAGNOSIS — I5043 Acute on chronic combined systolic (congestive) and diastolic (congestive) heart failure: Secondary | ICD-10-CM

## 2017-06-04 LAB — BLOOD GAS, ARTERIAL
Acid-Base Excess: 1.1 mmol/L (ref 0.0–2.0)
Allens test (pass/fail): POSITIVE — AB
Bicarbonate: 27.1 mmol/L (ref 20.0–28.0)
DELIVERY SYSTEMS: POSITIVE
EXPIRATORY PAP: 5
FIO2: 0.5
INSPIRATORY PAP: 12
MECHANICAL RATE: 8
O2 Saturation: 99.6 %
PATIENT TEMPERATURE: 37
RATE: 8 resp/min
pCO2 arterial: 49 mmHg — ABNORMAL HIGH (ref 32.0–48.0)
pH, Arterial: 7.35 (ref 7.350–7.450)
pO2, Arterial: 190 mmHg — ABNORMAL HIGH (ref 83.0–108.0)

## 2017-06-04 LAB — GLUCOSE, CAPILLARY: Glucose-Capillary: 183 mg/dL — ABNORMAL HIGH (ref 65–99)

## 2017-06-04 MED ORDER — FUROSEMIDE 10 MG/ML IJ SOLN
20.0000 mg | Freq: Two times a day (BID) | INTRAMUSCULAR | Status: DC
  Administered 2017-06-04 – 2017-06-07 (×6): 20 mg via INTRAVENOUS
  Filled 2017-06-04 (×6): qty 2

## 2017-06-04 MED ORDER — HALOPERIDOL LACTATE 5 MG/ML IJ SOLN
INTRAMUSCULAR | Status: AC
  Filled 2017-06-04: qty 1

## 2017-06-04 MED ORDER — FUROSEMIDE 20 MG PO TABS: 20.0000 mg | ORAL_TABLET | Freq: Once | ORAL | Status: DC

## 2017-06-04 MED ORDER — MORPHINE SULFATE (PF) 4 MG/ML IV SOLN
INTRAVENOUS | Status: AC
  Administered 2017-06-04: 4 mg
  Filled 2017-06-04: qty 1

## 2017-06-04 MED ORDER — FUROSEMIDE 10 MG/ML IJ SOLN
10.0000 mg | Freq: Once | INTRAMUSCULAR | Status: AC
  Administered 2017-06-03: 09:00:00 via INTRAVENOUS
  Filled 2017-06-04: qty 2

## 2017-06-04 MED ORDER — MORPHINE SULFATE (PF) 2 MG/ML IV SOLN
2.0000 mg | INTRAVENOUS | Status: DC | PRN
  Administered 2017-06-04 – 2017-06-05 (×5): 4 mg via INTRAVENOUS
  Filled 2017-06-04 (×5): qty 2

## 2017-06-04 MED ORDER — HALOPERIDOL LACTATE 5 MG/ML IJ SOLN
INTRAMUSCULAR | Status: AC
  Administered 2017-06-04: 5 mg
  Filled 2017-06-04: qty 1

## 2017-06-04 MED ORDER — BUDESONIDE 0.5 MG/2ML IN SUSP
0.5000 mg | Freq: Two times a day (BID) | RESPIRATORY_TRACT | Status: DC
Start: 2017-06-04 — End: 2017-06-06
  Administered 2017-06-04 – 2017-06-06 (×4): 0.5 mg via RESPIRATORY_TRACT
  Filled 2017-06-04 (×4): qty 2

## 2017-06-04 MED ORDER — HALOPERIDOL LACTATE 5 MG/ML IJ SOLN
2.0000 mg | Freq: Once | INTRAMUSCULAR | Status: AC
  Administered 2017-06-04: 2 mg via INTRAVENOUS

## 2017-06-04 MED ORDER — FUROSEMIDE 20 MG PO TABS: 40.0000 mg | ORAL_TABLET | Freq: Two times a day (BID) | ORAL | Status: DC

## 2017-06-04 NOTE — Progress Notes (Signed)
Patient had anxiety, panic attacks throughout shift. Difficulty breathing, SOB.  Medications given for relaxation.  Unable to console/calm patient verbally and/or physically.  Patient resting in bed at this time.

## 2017-06-04 NOTE — Progress Notes (Signed)
Patient was panicking and impulsive saying, "I cant breath". CPAP was in place, sats 100%, and heart rate was increasing. PRN clonazepam and morphine administered. This RN to sit with patient for 35 to 45 minutes rubbing her back and calming her until she was relaxed and went to sleep. Daughter at bedside. Will continue to monitor and endorse.

## 2017-06-04 NOTE — Progress Notes (Signed)
SUBJECTIVE: Patient appears to be comfortably sleeping with CPAP   Vitals:   06/04/17 0200 06/04/17 0300 06/04/17 0600 06/04/17 0800  BP: 100/63 (!) 142/103 109/60 114/65  Pulse: (!) 57 (!) 127 (!) 55 (!) 55  Resp: Temp: 98.4 F (36.9 C)   (!) 97.5 F (36.4 C)  TempSrc: Axillary   Axillary  SpO2: 100% 93% 100% 100%  Weight:      Height:        Intake/Output Summary (Last 24 hours) at 06/04/17 0854 Last data filed at 06/04/17 0200  Gross per 24 hour  Intake           265.74 ml  Output              600 ml  Net          -334.26 ml    LABS: Basic Metabolic Panel:  Recent Labs  84/69/62 1916 06/02/17 0619  NA 141 139  K 5.2* 4.9  CL 105 105  CO2 25 27  GLUCOSE 142* 161*  BUN 36* 37*  CREATININE 1.86* 1.70*  CALCIUM 9.5 9.0   Liver Function Tests: No results for input(s): AST, ALT, ALKPHOS, BILITOT, PROT, ALBUMIN in the last 72 hours. No results for input(s): LIPASE, AMYLASE in the last 72 hours. CBC:  Recent Labs  06/01/17 1916 06/02/17 0619  WBC 10.2 5.0  NEUTROABS 7.9*  --   HGB 8.7* 8.3*  HCT 26.9* 25.6*  MCV 82.3 82.3  PLT 282 246   Cardiac Enzymes:  Recent Labs  06/01/17 1916 06/02/17 0058 06/02/17 0619  TROPONINI 0.05* 0.04* 0.03*   BNP: Invalid input(s): POCBNP D-Dimer: No results for input(s): DDIMER in the last 72 hours. Hemoglobin A1C: No results for input(s): HGBA1C in the last 72 hours. Fasting Lipid Panel: No results for input(s): CHOL, HDL, LDLCALC, TRIG, CHOLHDL, LDLDIRECT in the last 72 hours. Thyroid Function Tests: No results for input(s): TSH, T4TOTAL, T3FREE, THYROIDAB in the last 72 hours.  Invalid input(s): FREET3 Anemia Panel: No results for input(s): VITAMINB12, FOLATE, FERRITIN, TIBC, IRON, RETICCTPCT in the last 72 hours.   PHYSICAL EXAM General: Well developed, well nourished, in no acute distress HEENT:  Normocephalic and atramatic Neck:  No JVD.  Lungs: Clear bilaterally to auscultation and  percussion. Heart: HRRR . Normal S1 and S2 without gallops or murmurs.  Abdomen: Bowel sounds are positive, abdomen soft and non-tender  Msk:  Back normal, normal gait. Normal strength and tone for age. Extremities: No clubbing, cyanosis or edema.   Neuro: Alert and oriented X 3. Psych:  Good affect, responds appropriately  TELEMETRY:Sinus rhythm  ASSESSMENT AND PLAN: Patient remains in sinus rhythm with history of cardiomyopathy with severe left ventricular systolic dysfunction. Continue current medications even though patient has sepsis and renal insufficiency.  Principal Problem:   COPD with acute exacerbation (HCC) Active Problems:   HTN (hypertension)   HLD (hyperlipidemia)   Acute on chronic combined systolic and diastolic CHF (congestive heart failure) (HCC)   CKD (chronic kidney disease), stage IV (HCC)    Maryse Brierley A, MD, [facility] 06/04/2017 8:54 AM

## 2017-06-04 NOTE — Consult Note (Signed)
[facility] Consultation    FiO2 (%):  [28 %] 28 %  INTAKE / OUTPUT:  Intake/Output Summary (Last 24 hours) at 06/04/17 0816 Last data filed at 06/04/17 0200  Gross per 24 hour  Intake           265.74 ml  Output              900 ml  Net          -634.26 ml   Name: Erica [name] MRN: [id number] DOB: [birth year]    ADMISSION DATE:  06/01/2017 CONSULTATION DATE:  06/03/2017  REFERRING MD :  hospitalist service  CHIEF COMPLAINT:  progressive shortness of breath   Synopsis Erica [name] is a 76 year old African-American female with a past medical history remarkable for hypertension, hyperlipidemia, chronic renal insufficiency,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 She was subsequently moved to the intensive care unit and started on noninvasive ventilation. She was given benzodiazepine and is now being started on Precedex for air hunger. Presently she states she is feeling better, on noninvasive ventilation,still with labored breathing respiratory rate of 32 with accessory muscle utilization.  HPI Patient with resp distress On biPAP Family at bedside On precedex inusion    REVIEW OF SYSTEMS:    The remainder of systems were reviewed and were found to be negative other than what is documented in the HPI.    VITAL SIGNS: Temp:  [97.5 F (36.4 C)-98.4 F (36.9 C)] 97.5 F (36.4 C) (10/08 0800) Pulse Rate:  [55-165] 55 (10/08 0800) Resp:  [13-26] 13 (10/08 0800) BP: (74-190)/(44-133) 114/65 (10/08 0800) SpO2:  [88 %-100 %] 100 % (10/08 0800) FiO2 (%):  [28 %] 28 % (10/07 1815) Weight:  [144 lb 2.9 oz (65.4 kg)] 144 lb 2.9 oz (65.4 kg) (10/07 0930) HEMODYNAMICS:   VENTILATOR SETTINGS: FiO2 (%):  [28 %] 28 % INTAKE / OUTPUT:  Intake/Output Summary (Last 24 hours) at 06/04/17 0816 Last data filed at 06/04/17 0200  Gross per 24 hour  Intake           265.74 ml  Output              900 ml  Net          -634.26 ml    Physical Examination:   VS: BP 114/65   Pulse (!) 55   Temp (!) 97.5 F (36.4 C) (Axillary)   Resp 13   Ht  (1.651 m)   Wt 144 lb 2.9 oz (65.4 kg)   SpO2 100%   BMI 23.99 kg/m   General Appearance: patient in respiratory distress on noninvasive ventilation in the intensive care  Neuro:Limited exam, no obvious focal deficits HEENT: PERRLA, EOM intact, no ptosis, no other lesions noticed;  Pulmonary: diffuse wheezing noted, prolonged expiratory phase clear bronchospasm appreciated Cardiovascular sinus tachycardia appreciated Abdomen: Benign, Soft, non-tender, No masses, hepatosplenomegaly, No lymphadenopathy Endoc: No evident thyromegaly, no signs of acromegaly. Skin:   warm, no rashes, no ecchymosis  Extremities: normal, no cyanosis, clubbing, no edema, warm with normal capillary refill.    LABS: Reviewed   LABORATORY PANEL:   CBC  Recent Labs Lab 06/02/17 0619  WBC 5.0  HGB 8.3*  HCT 25.6*  PLT 246    Chemistries   Recent Labs Lab 06/02/17 0619  NA 139  K 4.9  CL 105  CO2 27  GLUCOSE  161*  BUN 37*  CREATININE 1.70*  CALCIUM 9.0     Recent Labs Lab 06/02/17 0731 06/03/17 1813  GLUCAP 141* 151*    Recent Labs Lab 06/03/17 0917  PHART 7.32*  PCO2ART 52*  PO2ART 79*   No results for input(s): AST, ALT, ALKPHOS, BILITOT, ALBUMIN in the last 168 hours.  Cardiac Enzymes  Recent Labs Lab 06/02/17 0619  TROPONINI 0.03*    ASSESSMENT/PLAN   76 year old African-American  Female with acute on chronic respiratory failure due to acute COPD exacerbation as well as acute diastolic decompensation on noninvasive ventilation with encephalopathy     Respiratory distress. Acute hypercapnic respiratory failure on NIV, Solu-Medrol albuterol, Atrovent and azithromycin. We'll continue to monitor closely, on Precedex and benzodiazepine for  anxiety. If respiratory status worsens will proceed with intubation and mechanical ventilation-family updated.  Heart failure both systolic and diastolic. Patient is on Lasix and Entresto. Believe patient's respiratory failure is more consistent with COPD than heart failure at this time.Chest x-ray shows both hyperinflated lung fields with cardiomegaly  Sepsis. Blood cultures are consistent with methicillin-resistant staph aureus. We'll add vancomycin  Renal insufficiency. Will follow closely  Anemia.No evidence of active bleeding   Critical Care Time devoted to patient care services described in this note is 35 minutes.   Overall, patient is critically ill, prognosis is guarded.  Patient with Multiorgan failure and at high risk for cardiac arrest and death.  I would like to set up a meeting with family, I  recommend DNR status DNI status  Jaydien Panepinto Santiago Glad, M.D.  [facility]  Medical Director [facility] Medical Director [facility] Cardio-Pulmonary Department

## 2017-06-04 NOTE — Progress Notes (Signed)
Patient with end stage chronic resp failure, now with acute resp failure, on biPAP  I met with family and have explained patients poor resp status, they have agreed and consented to DNR/DNI status.  Will continue biPAP and assess resp status in 24 hrs.   Family are satisfied with Plan of action and management. All questions answered  Corrin Parker, M.D.  [facility]  Medical Director [place] Director [facility] Cardio-Pulmonary Department

## 2017-06-04 NOTE — Consult Note (Signed)
WOC Nurse wound consult note Reason for Consult:Application of negative pressure wound therapy.  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  Will bridge up leg towards knee Dressing procedure/placement/frequency: Cleanse wound to right leg with NS.  Fill wound bed with black foam and bridge away from wound bed.  hange MOn/WEd/Fri  Daughter is at bedside and SNF device is given to her with instructions to return to facility as she stated they were not holding her bed.   WOC team will follow Maple Hudson RN BSN [facility] Pager [phone]

## 2017-06-05 DIAGNOSIS — Z7189 Other specified counseling: Secondary | ICD-10-CM

## 2017-06-05 DIAGNOSIS — F411 Generalized anxiety disorder: Secondary | ICD-10-CM

## 2017-06-05 DIAGNOSIS — Z515 Encounter for palliative care: Secondary | ICD-10-CM

## 2017-06-05 DIAGNOSIS — R0603 Acute respiratory distress: Secondary | ICD-10-CM

## 2017-06-05 LAB — CULTURE, BLOOD (ROUTINE X 2): SPECIAL REQUESTS: ADEQUATE

## 2017-06-05 MED ORDER — METHYLPREDNISOLONE SODIUM SUCC 40 MG IJ SOLR
20.0000 mg | Freq: Two times a day (BID) | INTRAMUSCULAR | Status: DC
  Administered 2017-06-05 – 2017-06-06 (×2): 20 mg via INTRAVENOUS
  Filled 2017-06-05 (×2): qty 1

## 2017-06-05 MED ORDER — MORPHINE SULFATE (PF) 2 MG/ML IV SOLN
2.0000 mg | INTRAVENOUS | Status: DC | PRN
  Administered 2017-06-05: 4 mg via INTRAVENOUS
  Administered 2017-06-05: 2 mg via INTRAVENOUS
  Administered 2017-06-06: 4 mg via INTRAVENOUS
  Administered 2017-06-06: 2 mg via INTRAVENOUS
  Administered 2017-06-06: 4 mg via INTRAVENOUS
  Administered 2017-06-06: 2 mg via INTRAVENOUS
  Filled 2017-06-05 (×2): qty 1
  Filled 2017-06-05 (×4): qty 2
  Filled 2017-06-05: qty 1

## 2017-06-05 MED ORDER — DIAZEPAM 5 MG/ML IJ SOLN
5.0000 mg | Freq: Three times a day (TID) | INTRAMUSCULAR | Status: DC
  Administered 2017-06-05 – 2017-06-06 (×3): 5 mg via INTRAVENOUS
  Filled 2017-06-05 (×6): qty 2

## 2017-06-05 NOTE — Progress Notes (Signed)
[employer] [facility] [place] [facility]   PATIENT NAME: Erica [name]    MR#:  [id number]  DATE OF BIRTH:  [birth year]  SUBJECTIVE:  CHIEF COMPLAINT:   Chief Complaint  Patient presents with  . Respiratory Distress   - Patient had a rapid response this morning as patient care was being undertaken, just rolling over in bed and caused her to have an acute respiratory distress episode with tachycardia and heart rate in the 180s persistently. - Currently on BiPAP  In ICU  REVIEW OF SYSTEMS:  Review of Systems  Constitutional: Positive for malaise/fatigue. Negative for chills and fever.  HENT: Negative for congestion, ear discharge, hearing loss and nosebleeds.   Eyes: Negative for blurred vision.  Respiratory: Positive for cough, sputum production and shortness of breath. Negative for wheezing.   Cardiovascular: Positive for palpitations. Negative for chest pain and leg swelling.  Gastrointestinal: Positive for nausea and vomiting. Negative for abdominal pain, constipation and diarrhea.  Genitourinary: Negative for dysuria and urgency.  Musculoskeletal: Positive for myalgias.  Neurological: Negative for dizziness, speech change, focal weakness, seizures and headaches.  Psychiatric/Behavioral: Negative for depression. The patient is nervous/anxious.     DRUG ALLERGIES:  No Known Allergies  VITALS:  Blood pressure 117/61, pulse 60, temperature 97.7 F (36.5 C), temperature source Axillary, resp. rate 15, height  (1.651 m), weight 65 kg (143 lb 4.8 oz), SpO2 96 %.  PHYSICAL EXAMINATION:  Physical Exam  GENERAL:  76 y.o.-year-old patient Sitting by the side of bed, appears to be in no distress EYES: Pupils equal, round, reactive to light and accommodation. No scleral icterus. Extraocular muscles intact.  HEENT: Head atraumatic, normocephalic. Oropharynx and nasopharynx clear.  NECK:  Supple, no jugular venous distention. No thyroid enlargement, no tenderness.   LUNGS: Scant breath sounds with diffuse coarse wheezing all over the lung fields, no rhonchi or rails. Using accessory muscles to breathe on bipap CARDIOVASCULAR: S1, S2 normal. No rubs, or gallops. 2/6 systolic murmur present. ABDOMEN: Soft, nontender, nondistended. Bowel sounds present. No organomegaly or mass.  EXTREMITIES: No pedal edema, cyanosis, or clubbing.  NEUROLOGIC: Cranial nerves II through XII are intact. Muscle strength 3-4/5 in all extremities. Sensation intact. Gait not checked. Global weakness noted. PSYCHIATRIC: The patient is alert and oriented x 3.  SKIN: No obvious rash, lesion, or ulcer.    LABORATORY PANEL:   CBC  Recent Labs Lab 06/02/17 0619  WBC 5.0  HGB 8.3*  HCT 25.6*  PLT 246   ------------------------------------------------------------------------------------------------------------------  Chemistries   Recent Labs Lab 06/02/17 0619  NA 139  K 4.9  CL 105  CO2 27  GLUCOSE 161*  BUN 37*  CREATININE 1.70*  CALCIUM 9.0   ------------------------------------------------------------------------------------------------------------------  Cardiac Enzymes  Recent Labs Lab 06/02/17 0619  TROPONINI 0.03*   ------------------------------------------------------------------------------------------------------------------  RADIOLOGY:  Dg Chest Port 1 View  Result Date: 06/03/2017 CLINICAL DATA:  Coughing, weakness, diaphoretic, dyspnea EXAM: PORTABLE CHEST 1 VIEW COMPARISON:  06/01/2017 FINDINGS: Cardiac silhouette is borderline enlarged. No mediastinal or hilar masses. No convincing adenopathy. Lungs are hyperexpanded. Stable scarring in the lung bases. No evidence of pneumonia or pulmonary edema. No pleural effusion or pneumothorax. Skeletal structures are grossly intact. IMPRESSION: 1. No acute cardiopulmonary disease. 2. COPD. Electronically Signed   By: Amie Portland M.D.   On: 06/03/2017 09:11    EKG:   Orders placed or performed  during the hospital encounter of 06/01/17  . EKG 12-Lead  . EKG 12-Lead  . EKG  12-Lead  . EKG 12-Lead    ASSESSMENT AND PLAN:   76 year old female with past medical history significant for chronic respiratory failure secondary to COPD and combined CHF, on 2 L home oxygen at rest, CK D and hypertension presents from [facility] nursing home secondary to worsening shortness of breath  #1 acute on chronic hypoxic respiratory failure-secondary to COPD exacerbation and CHF exacerbation. - Initially required BiPAP, weaned off and placed on BiPAP again - Monitor in ICU -uses 2 L at the nursing home and on exertion uses up to 6 L at baseline -Continue steroids, cough medications. -Continue Lasix -On azithromycin , but antibiotics might be brought in. Blood cultures are negative on admission though BC ID showing MRSA positive - also has systolic heart failure, echo with EF of 25%. Appreciate cardiology consult. And entresto started - Pulm discussed with pt and family, now DNR.  #2 depression and anxiety-continue home medications. 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 Avoid nephrotoxins and monitor  #5 DVT prophylaxis heparin subcutaneous heparin  Discussed with intensivist on call Dr. Lonn Georgia and also updated patient's oldest daughter Ms. Angelique Blonder   All the records are reviewed and case discussed with Care Management/Social Workerr. Management plans discussed with the patient, family and they are in agreement.  CODE STATUS: DNR  TOTAL TIME SPENT IN TAKING CARE OF THIS PATIENT: 35 minutes.   POSSIBLE D/C IN 3-4 DAYS, DEPENDING ON CLINICAL CONDITION.   Altamese Dilling M.D on 06/05/2017 at 8:50 AM  Between 7am to 6pm - Pager - [phone]  After 6pm go to [email or web address] - password [email or web address]  [facility]  Office  [phone]  CC: Primary care physician; System, Pcp Not In

## 2017-06-05 NOTE — Progress Notes (Signed)
SUBJECTIVE: Patient appears to be comfortable sleeping with CPAP   Vitals:   06/05/17 0400 06/05/17 0500 06/05/17 0600 06/05/17 0800  BP: 128/60  123/69 117/61  Pulse: 65  60 60  Resp: Temp: 98.1 F (36.7 C)  97.9 F (36.6 C) 97.7 F (36.5 C)  TempSrc: Core (Comment)  Axillary Axillary  SpO2: 97%  98% 96%  Weight:  143 lb 4.8 oz (65 kg)    Height:        Intake/Output Summary (Last 24 hours) at 06/05/17 0908 Last data filed at 06/05/17 0713  Gross per 24 hour  Intake           585.82 ml  Output             1250 ml  Net          -664.18 ml    LABS: Basic Metabolic Panel: No results for input(s): NA, K, CL, CO2, GLUCOSE, BUN, CREATININE, CALCIUM, MG, PHOS in the last 72 hours. Liver Function Tests: No results for input(s): AST, ALT, ALKPHOS, BILITOT, PROT, ALBUMIN in the last 72 hours. No results for input(s): LIPASE, AMYLASE in the last 72 hours. CBC: No results for input(s): WBC, NEUTROABS, HGB, HCT, MCV, PLT in the last 72 hours. Cardiac Enzymes: No results for input(s): CKTOTAL, CKMB, CKMBINDEX, TROPONINI in the last 72 hours. BNP: Invalid input(s): POCBNP D-Dimer: No results for input(s): DDIMER in the last 72 hours. Hemoglobin A1C: No results for input(s): HGBA1C in the last 72 hours. Fasting Lipid Panel: No results for input(s): CHOL, HDL, LDLCALC, TRIG, CHOLHDL, LDLDIRECT in the last 72 hours. Thyroid Function Tests: No results for input(s): TSH, T4TOTAL, T3FREE, THYROIDAB in the last 72 hours.  Invalid input(s): FREET3 Anemia Panel: No results for input(s): VITAMINB12, FOLATE, FERRITIN, TIBC, IRON, RETICCTPCT in the last 72 hours.   PHYSICAL EXAM General: Well developed, well nourished, in no acute distress HEENT:  Normocephalic and atramatic Neck:  No JVD.  Lungs: Clear bilaterally to auscultation and percussion. Heart: HRRR . Normal S1 and S2 without gallops or murmurs.  Abdomen: Bowel sounds are positive, abdomen soft and non-tender   Msk:  Back normal, normal gait. Normal strength and tone for age. Extremities: No clubbing, cyanosis or edema.   Neuro: Alert and oriented X 3. Psych:  Good affect, responds appropriately  TELEMETRY:Sinus bradycardia 58 bpm  ASSESSMENT AND PLAN: Severe left ventricular systolic dysfunction with staph sepsis and congestive heart failure. Patient remains in sinus rhythm and hemodynamically stable at this time. Continue current medication.  Principal Problem:   COPD with acute exacerbation (HCC) Active Problems:   HTN (hypertension)   HLD (hyperlipidemia)   Acute on chronic combined systolic and diastolic CHF (congestive heart failure) (HCC)   CKD (chronic kidney disease), stage IV (HCC)    Shruthi Northrup A, MD, [facility] 06/05/2017 9:08 AM

## 2017-06-05 NOTE — Progress Notes (Signed)
Pt becomes extremely anxious when she is moved around in the bed or if attempting to remove bi-PAP. Offered oral care and pt became anxious when mask was removed. Bi-PAP mask placed back on pt immediately and pt reassured verbally. Will not be giving oral meds at this time due to same. Will continue to assess.

## 2017-06-05 NOTE — Consult Note (Signed)
Consultation Note Date: 06/05/2017   Patient Name: Erica [name]  DOB: 02/26/1941  MRN: [id number]  Age / Sex: 76 y.o., female  PCP: System, Pcp Not In Referring Physician: Vaughan Basta, *  Reason for Consultation: Establishing goals of care  HPI/Patient Profile: 76 y.o. female  with past medical history of COPD on home oxygen, systolic and diastolic congestive heart failure, hypertension, hyperlipidemia, chronic renal insufficiency admitted on 06/01/2017 with respiratory distress. In ED, patient hypoxic requiring BiPAP. She was able to wean off BiPAP. Workup consistent with COPD and CHF exacerbations. On 10/7, rapid response called for increased work of breathing and respiratory distress. Transferred to ICU for BiPAP and Precedex infusion initiated. Patient receiving lasix and entresto. Cardiology following. Blood cultures positive for methicillin-resistant staph aureus. Palliative medicine consultation for goals of care.   Clinical Assessment and Goals of Care: I have reviewed medical records, discussed with care team, and met with daughters Arbie Cookey and Butch Penny) and son Nicole Kindred) in family waiting area to discuss diagnosis, prognosis, [place], EOL wishes, disposition and options. Shortly after, briefly spoke with daughter Anitra Lauth.   Patient with intermittent confusion and anxiety. Remains on BiPAP, precedex gtt, and requiring scheduled valium and prn morphine.   Introduced Palliative Medicine as specialized medical care for people living with serious illness. It focuses on providing relief from the symptoms and stress of a serious illness.   We discussed a brief life review of the patient. Widowed. Husband died in 2002-11-07. Patient has five adult children and multiple grandchildren. She has lived with daughter Butch Penny) for many years and had ongoing COPD and CHF. Children share her recent decline secondary to right  knee infection currently with wound vac, recurrent falls, and increased dyspnea with exertion. Baseline 3L  for which she has been increasing to 6L due to SOB. It was recommended she go to rehab by PCP after f/u knee appointment. Family was in the process of transitioning her to long-term care bed.   Discussed hospital diagnoses, interventions, and underlying co-morbidities. Discussed chronic, progressive nature of COPD/CHF. At this point in the conversation, Butch Penny becomes frustrated and leaves for a doctors appointment.   Arbie Cookey then explains how the children are at "different stages" of accepting their mother is at EOL. Arbie Cookey is a Insurance underwriter and has a good understanding of hospital diagnoses with underlying end-stage COPD and poor prognosis. She tells me her mother has been having symptoms consistent with EOL including seeing and talking with deceased family members. [name] has been telling family members she is dying.   Advanced directives and concepts specific to code status were discussed. [name] does NOT have a documented living will or HCPOA. Adult children discuss decisions together but Anitra Lauth has been spokesperson. Arbie Cookey recaps their conversation with Dr. Mortimer Fries yesterday and confirms decision for DNR/DNI.  The difference between aggressive medical intervention and comfort care was discussed. Arbie Cookey and Nicole Kindred speak of wanting comfort for their mother. "We do not want to see her suffer." Educated on transition to comfort measures with focus on  comfort and dignity at EOL. Educated on medications as needed for symptom management.   Hospice services outpatient were explained and offered. Arbie Cookey and Nicole Kindred speak of hospice being the best support for their mother at EOL. Arbie Cookey has been educating the family on EOL/hospice services. A decision has not yet been made for full transition to comfort care. She again speaks of her siblings being at different stages of grieving/acceptance with EOL.    Questions and concerns were addressed. Therapeutic listening and emotional/spiritual support provided.    SUMMARY OF RECOMMENDATIONS    DNR/DNI. Continue current interventions.   Educated on transition to comfort measures and hospice services. Family considering hospice options. Eligible for inpatient hospice facility.   PMT will f/u daily.   Code Status/Advance Care Planning:  DNR  Symptom Management:   Morphine 2-62m IV q2h prn dyspnea/air hunger  Palliative Prophylaxis:   Aspiration, Delirium Protocol, Frequent Pain Assessment, Oral Care and Turn Reposition  Additional Recommendations (Limitations, Scope, Preferences):  DNR/DNI. Continue current interventions  Psycho-social/Spiritual:   Desire for further Chaplaincy support: yes  Additional Recommendations: Caregiving  Support/Resources and Education on Hospice  Prognosis:   Poor with acute on chronic respiratory failure secondary to end-stage COPD and systolic/diastolic congestive heart failure.  Discharge Planning: To Be Determined      Primary Diagnoses: Present on Admission: . COPD with acute exacerbation ([place] . HTN (hypertension) . HLD (hyperlipidemia) . Acute on chronic combined systolic and diastolic CHF (congestive heart failure) ([place] . CKD (chronic kidney disease), stage IV ([place]   I have reviewed the medical record, interviewed the patient and family, and examined the patient. The following aspects are pertinent.  Past Medical History:  Diagnosis Date  . Chronic combined systolic and diastolic CHF (congestive heart failure) ([place]   . CKD (chronic kidney disease)   . COPD (chronic obstructive pulmonary disease) ([place]   . HLD (hyperlipidemia)   . HTN (hypertension)    Social History   Social History  . Marital status: Unknown    Spouse name: N/A  . Number of children: N/A  . Years of education: N/A   Social History Main Topics  . Smoking status: Former SResearch scientist (life sciences) . Smokeless tobacco:  Never Used  . Alcohol use No  . Drug use: No  . Sexual activity: Not Asked   Other Topics Concern  . None   Social History Narrative  . None   Family History  Problem Relation Age of Onset  . Cancer Mother   . Hypertension Mother   . Heart disease Sister    Scheduled Meds: . azithromycin  500 mg Oral Daily  . budesonide (PULMICORT) nebulizer solution  0.5 mg Nebulization BID  . busPIRone  7.5 mg Oral TID  . carvedilol  3.125 mg Oral BID WC  . chlorpheniramine-HYDROcodone  5 mL Oral Q12H  . citalopram  20 mg Oral Daily  . diazepam  5 mg Intravenous Q8H  . furosemide  20 mg Intravenous BID  . heparin  5,000 Units Subcutaneous Q8H  . ipratropium-albuterol  3 mL Nebulization Q6H  . mouth rinse  15 mL Mouth Rinse BID  . methylPREDNISolone (SOLU-MEDROL) injection  20 mg Intravenous BID  . mometasone-formoterol  2 puff Inhalation BID  . pantoprazole  40 mg Oral Daily  . roflumilast  500 mcg Oral Daily  . sacubitril-valsartan  1 tablet Oral BID   Continuous Infusions: . dexmedetomidine (PRECEDEX) IV infusion 1 mcg/kg/hr (06/05/17 1155)  . diltiazem (CARDIZEM) infusion 5 mg/hr (06/05/17 1000)  PRN Meds:.acetaminophen **OR** acetaminophen, albuterol, clonazePAM, HYDROcodone-acetaminophen, morphine injection, ondansetron **OR** ondansetron (ZOFRAN) IV Medications Prior to Admission:  Prior to Admission medications   Medication Sig Start Date End Date Taking? Authorizing Provider  allopurinol (ZYLOPRIM) 100 MG tablet Take 100 mg by mouth daily.   Yes [provider]  busPIRone (BUSPAR) 7.5 MG tablet Take 7.5 mg by mouth 3 (three) times daily.   Yes [provider]  citalopram (CELEXA) 20 MG tablet Take 20 mg by mouth daily.   Yes [provider]  fluticasone (FLONASE) 50 MCG/ACT nasal spray Place 2 sprays into both nostrils daily.   Yes [provider]  lidocaine (XYLOCAINE) 5 % ointment Apply 1 application topically daily as needed.   Yes  [provider]  metolazone (ZAROXOLYN) 2.5 MG tablet Take 2.5 mg by mouth daily.   Yes [provider]  mirtazapine (REMERON) 15 MG tablet Take 15 mg by mouth at bedtime.   Yes [provider]  omeprazole (PRILOSEC) 40 MG capsule Take 40 mg by mouth daily.   Yes [provider]  oxyCODONE (OXY IR/ROXICODONE) 5 MG immediate release tablet Take 5 mg by mouth every 4 (four) hours as needed for severe pain.   Yes [provider]  predniSONE (DELTASONE) 1 MG tablet Take 2 mg by mouth daily with breakfast.   Yes [provider]  roflumilast (DALIRESP) 500 MCG TABS tablet Take 500 mcg by mouth daily.   Yes [provider]  spironolactone (ALDACTONE) 25 MG tablet Take 25 mg by mouth daily.   Yes [provider]  tiotropium (SPIRIVA) 18 MCG inhalation capsule Place 18 mcg into inhaler and inhale daily.   Yes [provider]   No Known Allergies Review of Systems  Respiratory: Positive for shortness of breath.   Psychiatric/Behavioral:       Anxiety   Physical Exam  Constitutional: She is easily aroused. She appears ill.  HENT:  Head: Normocephalic and atraumatic.  Cardiovascular: Regular rhythm.   Pulmonary/Chest: No accessory muscle usage. No tachypnea. No respiratory distress. She has decreased breath sounds.  BiPAP.  Abdominal: Normal appearance.  Neurological: She is alert and easily aroused.  Periods of confusion  Skin: Skin is warm and dry.  Nursing note and vitals reviewed.  Vital Signs: BP 118/65   Pulse 60   Temp 97.7 F (36.5 C) (Axillary)   Resp 12   Ht 5' 5"  (1.651 m)   Wt 65 kg (143 lb 4.8 oz)   SpO2 97%   BMI 23.85 kg/m  Pain Assessment: No/denies pain POSS *See Group Information*: S-Acceptable,Sleep, easy to arouse Pain Score: Asleep  SpO2: SpO2: 97 % O2 Device:SpO2: 97 % O2 Flow Rate: .O2 Flow Rate (L/min): 6 L/min  IO: Intake/output summary:   Intake/Output Summary (Last 24  hours) at 06/05/17 1436 Last data filed at 06/05/17 1000  Gross per 24 hour  Intake              512 ml  Output             1250 ml  Net             -738 ml    LBM: Last BM Date: 06/01/17 Baseline Weight: Weight: 76.4 kg (168 lb 6.9 oz) Most recent weight: Weight: 65 kg (143 lb 4.8 oz)     Palliative Assessment/Data: PPS 30%   Flowsheet Rows     Most Recent Value  Intake Tab  Referral Department  Critical care  Unit at Time of Referral  ICU  Palliative Care Primary Diagnosis  Other (Comment) [COPD, CHF, acute on chronic respiratory failure]  Palliative Care Type  New Palliative care  Reason for referral  Clarify Goals of Care  Date first seen by Palliative Care  06/05/17  Clinical Assessment  Palliative Performance Scale Score  30%  Psychosocial & Spiritual Assessment  Palliative Care Outcomes  Patient/Family meeting held?  Yes  Who was at the meeting?  daughters Butch Penny and Arbie Cookey) and son Nicole Kindred)  [place] goals of care, Provided end of life care assistance, Provided psychosocial or spiritual support, ACP counseling assistance, Counseled regarding hospice, Improved pain interventions, Improved non-pain symptom therapy      Time In: 1315 Time Out: 1430 Time Total: 40mn Greater than 50%  of this time was spent counseling and coordinating care related to the above assessment and plan.  Signed by:  MIhor Dow FNP-C Palliative Medicine Team  Phone: [phone]: [phone]  Please contact Palliative Medicine Team phone at [phone] questions and concerns.  For individual provider: See AShea Evans

## 2017-06-05 NOTE — Consult Note (Signed)
[facility] Critical Care Medicine Consultation    FiO2 (%):  [50 %] 50 %  INTAKE / OUTPUT:  Intake/Output Summary (Last 24 hours) at 06/05/17 0847 Last data filed at 06/05/17 0713  Gross per 24 hour  Intake           585.82 ml  Output             1250 ml  Net          -664.18 ml   Name: Erica [name] MRN: [id number] DOB: March 18, 1941    ADMISSION DATE:  06/01/2017 CONSULTATION DATE:  06/03/2017  REFERRING MD :  hospitalist service  CHIEF COMPLAINT:  progressive shortness of breath   Synopsis Erica [name] is a 76 year old African-American female with a past medical history remarkable for hypertension, hyperlipidemia, chronic renal insufficiency,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 She was subsequently moved to the intensive care unit and started on noninvasive ventilation. She was given benzodiazepine and is now being started on Precedex for air hunger. Presently she states she is feeling better, on noninvasive ventilation,still with labored breathing respiratory rate of 32 with accessory muscle utilization.  HPI Patient with resp distress On biPAP Family at bedside On precedex inusion DNR/DNI   REVIEW OF SYSTEMS:    The remainder of systems were reviewed and were found to be negative other than what is documented in the HPI.    VITAL SIGNS: Temp:  [97.7 F (36.5 C)-98.4 F (36.9 C)] 97.7 F (36.5 C) (10/09 0800) Pulse Rate:  [51-134] 60 (10/09 0800) Resp:  [14-28] 15 (10/09 0800) BP: (104-137)/(53-89) 117/61 (10/09 0800) SpO2:  [89 %-100 %] 96 % (10/09 0800) FiO2 (%):  [50 %] 50 % (10/09 0600) Weight:  [143 lb 4.8 oz (65 kg)] 143 lb 4.8 oz (65 kg) (10/09 0500) HEMODYNAMICS:   VENTILATOR SETTINGS: FiO2 (%):  [50 %] 50 % INTAKE / OUTPUT:  Intake/Output Summary (Last 24 hours) at 06/05/17 0847 Last data filed at 06/05/17 0713  Gross per 24 hour  Intake           585.82 ml  Output             1250 ml  Net          -664.18 ml    Physical Examination:   VS: BP 117/61   Pulse 60   Temp 97.7 F (36.5 C) (Axillary)   Resp 15   Ht  (1.651 m)   Wt 143 lb 4.8 oz (65 kg)   SpO2 96%   BMI 23.85 kg/m   General Appearance: patient in respiratory distress on noninvasive ventilation in the intensive care  Neuro:Limited exam, no obvious focal deficits HEENT: PERRLA, EOM intact, no ptosis, no other lesions noticed;  Pulmonary: diffuse wheezing noted, prolonged expiratory phase clear bronchospasm appreciated Cardiovascular sinus tachycardia appreciated Abdomen: Benign, Soft, non-tender, No masses, hepatosplenomegaly, No lymphadenopathy Endoc: No evident thyromegaly, no signs of acromegaly. Skin:   warm, no rashes, no ecchymosis  Extremities: normal, no cyanosis, clubbing, no edema, warm with normal capillary refill.    LABS: Reviewed   LABORATORY PANEL:   CBC  Recent Labs Lab 06/02/17 0619  WBC 5.0  HGB 8.3*  HCT 25.6*  PLT 246    Chemistries   Recent Labs Lab 06/02/17 0619  NA 139  K 4.9  CL 105  CO2 27  GLUCOSE 161*  BUN 37*  CREATININE 1.70*  CALCIUM 9.0     Recent Labs Lab 06/02/17 0731 06/03/17 0914 06/03/17 1813  GLUCAP 141* 183* 151*    Recent Labs Lab 06/03/17 0917 06/04/17 0810  PHART 7.32* 7.35  PCO2ART 52* 49*  PO2ART 79* 190*   No results for input(s): AST, ALT, ALKPHOS, BILITOT, ALBUMIN in the last 168 hours.  Cardiac Enzymes  Recent Labs Lab 06/02/17 0619  TROPONINI 0.03*    ASSESSMENT/PLAN   76 year old African-American  Female with acute on chronic respiratory failure due to acute COPD exacerbation as well as acute diastolic decompensation on noninvasive ventilation with encephalopathy  Respiratory distress. Acute hypercapnic respiratory failure on NIV, Solu-Medrol albuterol, Atrovent and azithromycin. We'll continue to monitor closely, on  Precedex and benzodiazepine for anxiety. If respiratory status worsens will proceed with intubation and mechanical ventilation-family updated.  Heart failure both systolic and diastolic. Patient is on Lasix and Entresto. Believe patient's respiratory failure is more consistent with COPD than heart failure at this time.Chest x-ray shows both hyperinflated lung fields with cardiomegaly  Sepsis. Blood cultures are consistent with methicillin-resistant staph aureus. We'll add vancomycin  Renal insufficiency. Will follow closely  Anemia.No evidence of active bleeding   Critical Care Time devoted to patient care services described in this note is 35 minutes.   Overall, patient is critically ill, prognosis is guarded.  Patient with Multiorgan failure and at high risk for cardiac arrest and death.  Patient is DNR/DNI-failed to come off of biPAP last night, will try again   Erica [name], M.D.  [facility]rica [name] [facility]  Medical Director [facility] Medical Director [facility] Cardio-Pulmonary Department

## 2017-06-05 NOTE — Progress Notes (Signed)
Patient's daughter Angelique Blonder  at bedside and infomed nurse that she fixed the patient face mask when it got loose. RN educated patient's daughter to inform the nurse whenever the face mask becomes loose or anything appears out of the ordinary as staff may need to assess and allow staff to make adjustments. Patient's daughter agreed.
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
## 2017-06-06 DIAGNOSIS — N184 Chronic kidney disease, stage 4 (severe): Secondary | ICD-10-CM

## 2017-06-06 DIAGNOSIS — Z515 Encounter for palliative care: Secondary | ICD-10-CM

## 2017-06-06 LAB — BASIC METABOLIC PANEL
ANION GAP: 9 (ref 5–15)
BUN: 70 mg/dL — ABNORMAL HIGH (ref 6–20)
CHLORIDE: 110 mmol/L (ref 101–111)
CO2: 28 mmol/L (ref 22–32)
CREATININE: 1.53 mg/dL — AB (ref 0.44–1.00)
Calcium: 9.3 mg/dL (ref 8.9–10.3)
GFR calc non Af Amer: 32 mL/min — ABNORMAL LOW (ref >60)
GFR, EST AFRICAN AMERICAN: 38 mL/min — AB (ref >60)
Glucose, Bld: 147 mg/dL — ABNORMAL HIGH (ref 65–99)
POTASSIUM: 4.1 mmol/L (ref 3.5–5.1)
SODIUM: 147 mmol/L — AB (ref 135–145)

## 2017-06-06 LAB — CULTURE, BLOOD (ROUTINE X 2)
Culture: NO GROWTH
SPECIAL REQUESTS: ADEQUATE

## 2017-06-06 MED ORDER — GLYCOPYRROLATE 0.2 MG/ML IJ SOLN
0.2000 mg | INTRAMUSCULAR | Status: DC | PRN
  Administered 2017-06-08: 0.2 mg via INTRAVENOUS
  Filled 2017-06-06 (×2): qty 1

## 2017-06-06 MED ORDER — LORAZEPAM 2 MG/ML IJ SOLN: 1.0000 mg | INTRAMUSCULAR | Status: DC | PRN

## 2017-06-06 MED ORDER — SODIUM CHLORIDE 0.9 % IV SOLN
2.0000 mg/h | INTRAVENOUS | Status: AC
  Administered 2017-06-06: 2 mg/h via INTRAVENOUS
  Filled 2017-06-06 (×2): qty 10

## 2017-06-06 MED ORDER — FENTANYL 12 MCG/HR TD PT72
12.5000 ug | MEDICATED_PATCH | TRANSDERMAL | Status: DC
  Administered 2017-06-06: 12.5 ug via TRANSDERMAL
  Filled 2017-06-06: qty 1

## 2017-06-06 MED ORDER — MORPHINE BOLUS VIA INFUSION
2.0000 mg | INTRAVENOUS | Status: AC | PRN
  Administered 2017-06-06 – 2017-06-07 (×3): 2 mg via INTRAVENOUS
  Filled 2017-06-06: qty 2

## 2017-06-06 MED ORDER — LORAZEPAM 2 MG/ML IJ SOLN
1.0000 mg | INTRAMUSCULAR | Status: DC | PRN
  Administered 2017-06-06: 1 mg via INTRAVENOUS
  Filled 2017-06-06: qty 1

## 2017-06-06 NOTE — Progress Notes (Signed)
Daily Progress Note   Patient Name: Erica [name]       Date: 06/06/2017 DOB: 1941-07-21  Age: 76 y.o. MRN#: [id number] Attending Physician: Altamese Dilling, * Primary Care Physician: System, Pcp Not In Admit Date: 06/01/2017  Reason for Consultation/Follow-up: Establishing goals of care  Subjective/GOC: Visited with patient twice throughout the day. Initially, she was awake, alert, and oriented. Comfortable on 4L. Later this afternoon, patient had an anxiety attack contributing to increased work of breathing and tachycardia. RN gave prn morphine and ativan and patient placed back on BiPAP.   Spoke at length with children Alinda Money, Pincus Sanes, and Okey Regal) in family waiting room. The patient's sister, Harriett Sine also present.  Again discussed hospital diagnoses, interventions, and underlying progressive COPD and CHF. Discussed guarded prognosis secondary to progressive disease and symptoms requiring frequent medications for comfort. Educated on aggressive medical interventions versus transition to comfort measures only with focus on symptom management and comfort at EOL. Discussed hospice options.   Alinda Money becomes angry during the conversation. He believes "hospice will kill" his mother and she will be "pumped full of morphine." Explained that hospice does not hasten death and medication will be given as needed to ensure comfort, but also explaining my concern of her requiring high doses of medication to relieve suffering. She becomes very anxious off of the BiPAP. (He was at the bedside when she had her anxiety attack earlier). Alinda Money shares how close he is with his mother, calling 4-5 times a day. He remains angry and states "do what you need to do." He leaves the room during the conversation.   Spoke  with Pincus Sanes and Okey Regal. Again confirmed that there is NOT a documented HCPOA and children make decisions together. Pincus Sanes and Okey Regal speak of wanting "comfort" and "peace" for their mother. Answered questions regarding transition to comfort measures and hospice facility. Pincus Sanes and Okey Regal are agreeable to start morphine infusion for comfort understanding she can have bolus doses as needed. Educated on discontinuing medications/interventions not aimed at comfort. Leaning towards hospice facility but want to discuss with their mother tonight. Pincus Sanes states "this is the only option."   Therapeutic listening and emotional/spiritual support provided. Challenging family dynamics who are in different stages of grief.   Length of Stay: 5  Current Medications: Scheduled Meds:  . busPIRone  7.5 mg Oral TID  .  carvedilol  3.125 mg Oral BID WC  . citalopram  20 mg Oral Daily  . fentaNYL  12.5 mcg Transdermal Q72H  . furosemide  20 mg Intravenous BID  . mouth rinse  15 mL Mouth Rinse BID  . sacubitril-valsartan  1 tablet Oral BID    Continuous Infusions: . morphine      PRN Meds: acetaminophen **OR** acetaminophen, albuterol, glycopyrrolate, HYDROcodone-acetaminophen, LORazepam, morphine, ondansetron **OR** ondansetron (ZOFRAN) IV  Physical Exam  Constitutional: She is cooperative. She appears ill.  HENT:  Head: Normocephalic and atraumatic.  Cardiovascular: Tachycardia present.   Pulmonary/Chest: She has decreased breath sounds.  Dyspnea at rest with frequent anxiety attacks. Currently on BiPAP  Abdominal: Normal appearance.  Neurological: She is alert.  Periods of confusion  Skin: Skin is warm and dry.  Psychiatric: Her mood appears anxious.  Nursing note and vitals reviewed.          Vital Signs: BP (!) 137/54   Pulse (!) 112   Temp 97.8 F (36.6 C) (Axillary)   Resp (!) 23   Ht  (1.651 m)   Wt 65 kg (143 lb 4.8 oz)   SpO2 98%   BMI 23.85 kg/m  SpO2: SpO2: 98 % O2  Device: O2 Device: Bi-PAP O2 Flow Rate: O2 Flow Rate (L/min): 4 L/min  Intake/output summary:   Intake/Output Summary (Last 24 hours) at 06/06/17 1742 Last data filed at 06/06/17 1448  Gross per 24 hour  Intake           961.01 ml  Output             2100 ml  Net         -1138.99 ml   LBM: Last BM Date: 06/06/17 Baseline Weight: Weight: 76.4 kg (168 lb 6.9 oz) Most recent weight: Weight: 65 kg (143 lb 4.8 oz)       Palliative Assessment/Data: PPS 30%   Flowsheet Rows     Most Recent Value  Intake Tab  Referral Department  Critical care  Unit at Time of Referral  ICU  Palliative Care Primary Diagnosis  Other (Comment) [COPD, CHF, acute on chronic respiratory failure]  Palliative Care Type  New Palliative care  Reason for referral  Clarify Goals of Care  Date first seen by Palliative Care  06/05/17  Clinical Assessment  Palliative Performance Scale Score  30%  Psychosocial & Spiritual Assessment  Palliative Care Outcomes  Patient/Family meeting held?  Yes  Who was at the meeting?  patient, son, two daughters, sister  Palliative Care Outcomes  Clarified goals of care, Provided end of life care assistance, Provided psychosocial or spiritual support, ACP counseling assistance, Improved pain interventions, Improved non-pain symptom therapy, Changed to focus on comfort, Counseled regarding hospice      Patient Active Problem List   Diagnosis Date Noted  . Respiratory distress   . Anxiety state   . Palliative care by specialist   . Goals of care, counseling/discussion   . COPD with acute exacerbation (HCC) 06/01/2017  . HTN (hypertension) 06/01/2017  . HLD (hyperlipidemia) 06/01/2017  . Acute on chronic combined systolic and diastolic CHF (congestive heart failure) (HCC) 06/01/2017  . CKD (chronic kidney disease), stage IV (HCC) 06/01/2017    Palliative Care Assessment & Plan   Patient Profile: 76 y.o. female with past medical history of COPD on home oxygen, systolic  and diastolic congestive heart failure, hypertension, hyperlipidemia, chronic renal insufficiency admitted on 06/01/2017 with respiratory distress. In ED, patient hypoxic requiring  BiPAP. She was able to wean off BiPAP. Workup consistent with COPD and CHF exacerbations. On 10/7, rapid response called for increased work of breathing and respiratory distress. Transferred to ICU for BiPAP and Precedex infusion initiated. Patient receiving lasix and entresto. Cardiology following. Blood cultures positive for methicillin-resistant staph aureus. Palliative medicine consultation for goals of care.   Assessment: Acute on chronic respiratory failure COPD exacerbation CHF exacerbation Sepsis Anxiety Renal insufficiency Anemia  Recommendations/Plan:  Transitioning to comfort measures only.   Symptom management  RN to start Morphine continuous infusion at /hr. May titrate if patient requiring frequent bolus doses.  RN may bolus morphine via infusion  q83min prn pain/dyspnea/air hunger  Ativan 1-2mg  IV q2h prn anxiety  Robinul 0.2mg  IV q4h prn secretions  Will continue cardiac medications for now since patient can swallow pills.  RN/RRT to discontinue BiPAP once patient appears comfortable on morphine infusion. Treat dyspnea/anxiety with prn medications.   Comfort screen. May transfer to 1C once off BiPAP. Daughters agreeable.  PMT will f/u in AM to further discuss hospice facility with patient/family.   Code Status: DNR   Code Status Orders        Start     Ordered   06/04/17 1225  Do not attempt resuscitation (DNR)  Continuous    Question Answer Comment  In the event of cardiac or respiratory ARREST Do not call a "code blue"   In the event of cardiac or respiratory ARREST Do not perform Intubation, CPR, defibrillation or ACLS   In the event of cardiac or respiratory ARREST Use medication by any route, position, wound care, and other measures to relive pain and suffering. May  use oxygen, suction and manual treatment of airway obstruction as needed for comfort.      06/04/17 1224    Code Status History    Date Active Date Inactive Code Status Order ID Comments User Context   06/02/2017 12:18 AM 06/04/2017 12:24 PM Full Code [id number]  Oralia Manis, MD Inpatient    Advance Directive Documentation     Most Recent Value  Type of Advance Directive  Healthcare Power of Attorney  Pre-existing out of facility DNR order (yellow form or pink MOST form)  -  "MOST" Form in Place?  -       Prognosis:   < 2 weeks: poor prognosis with acute on chronic respiratory failure secondary to end-stage COPD and CHF exacerbation. Requiring frequent prn medications to ensure comfort.   Discharge Planning:  To Be Determined transition to comfort measures with goal of transfer to hospice facility.  Care plan was discussed with patient, son Alinda Money), daughters Pincus Sanes and Okey Regal), sister Harriett Sine), and RN  Thank you for allowing the Palliative Medicine Team to assist in the care of this patient.   Time In: 1520 Time Out: 1720 Total Time 120 Prolonged Time Billed yes      Greater than 50%  of this time was spent counseling and coordinating care related to the above assessment and plan.  Vennie Homans, FNP-C Palliative Medicine Team  Phone: [phone] Fax: [phone]  Please contact Palliative Medicine Team phone at [phone] for questions and concerns.

## 2017-06-06 NOTE — Progress Notes (Signed)
Chaplain received a page for pt. Erica [name] contacted nurse before visiting with pt. Nurse stated that pt's daughter is frustrated with her siblings for abandoning their mother and would benefit from talking to a chaplain. CH met with pt and daughter but daughter stated she did not want to talk to chaplain at this time. [place] to follow up pt and daughter as needed.   06/06/17 0700  Clinical Encounter Type  Visited With Patient and family together  Visit Type Initial;Follow-up;Other (Comment)  Referral From Nurse  Consult/Referral To Chaplain  Spiritual Encounters  Spiritual Needs Prayer;Other (Comment)

## 2017-06-06 NOTE — Progress Notes (Signed)
SUBJECTIVE: Pt is alert and is feeling better today, less short of breath with no chest pain.   Vitals:   06/06/17 0600 06/06/17 0700 06/06/17 0750 06/06/17 0800  BP: 122/79 (!) 115/59  112/62  Pulse: 83 89 84 85  Resp: Temp:    97.8 F (36.6 C)  TempSrc:    Axillary  SpO2: 97% 97% 96% 96%  Weight:      Height:        Intake/Output Summary (Last 24 hours) at 06/06/17 1122 Last data filed at 06/06/17 0900  Gross per 24 hour  Intake           506.11 ml  Output             1750 ml  Net         -1243.89 ml    LABS: Basic Metabolic Panel:  Recent Labs  16/10/96 0547  NA 147*  K 4.1  CL 110  CO2 28  GLUCOSE 147*  BUN 70*  CREATININE 1.53*  CALCIUM 9.3   Liver Function Tests: No results for input(s): AST, ALT, ALKPHOS, BILITOT, PROT, ALBUMIN in the last 72 hours. No results for input(s): LIPASE, AMYLASE in the last 72 hours. CBC: No results for input(s): WBC, NEUTROABS, HGB, HCT, MCV, PLT in the last 72 hours. Cardiac Enzymes: No results for input(s): CKTOTAL, CKMB, CKMBINDEX, TROPONINI in the last 72 hours. BNP: Invalid input(s): POCBNP D-Dimer: No results for input(s): DDIMER in the last 72 hours. Hemoglobin A1C: No results for input(s): HGBA1C in the last 72 hours. Fasting Lipid Panel: No results for input(s): CHOL, HDL, LDLCALC, TRIG, CHOLHDL, LDLDIRECT in the last 72 hours. Thyroid Function Tests: No results for input(s): TSH, T4TOTAL, T3FREE, THYROIDAB in the last 72 hours.  Invalid input(s): FREET3 Anemia Panel: No results for input(s): VITAMINB12, FOLATE, FERRITIN, TIBC, IRON, RETICCTPCT in the last 72 hours.   PHYSICAL EXAM General: Well developed, well nourished, in no acute distress HEENT:  Normocephalic and atramatic Neck:  No JVD.  Lungs: Clear bilaterally to auscultation and percussion. Heart: HRRR . Normal S1 and S2 without gallops or murmurs.  Abdomen: Bowel sounds are positive, abdomen soft and non-tender  Msk:  Back normal,  normal gait. Normal strength and tone for age. Extremities: No clubbing, cyanosis or edema.   Neuro: Alert and oriented X 3. Psych:  Good affect, responds appropriately  TELEMETRY: NSR 72bpm  ASSESSMENT AND PLAN: Acute respiratory failure secondary to COPD/CHF exacerbation with severe cardiomyopathy. Continue Entresto and coreg with lasix. Blood pressure is low-normal but stable. Continue to monitor closely and may increase coreg as status stabilizes.    Principal Problem:   COPD with acute exacerbation (HCC) Active Problems:   HTN (hypertension)   HLD (hyperlipidemia)   Acute on chronic combined systolic and diastolic CHF (congestive heart failure) (HCC)   CKD (chronic kidney disease), stage IV (HCC)   Respiratory distress   Anxiety state   Palliative care by specialist   Goals of care, counseling/discussion    Caroleen Hamman, NP-C 06/06/2017 11:22 AM

## 2017-06-06 NOTE — Progress Notes (Signed)
[facility] - [place] at [facility]   PATIENT NAME: Erica [name]    MR#:  [id number]  DATE OF BIRTH:  07/14/1941  SUBJECTIVE:  CHIEF COMPLAINT:   Chief Complaint  Patient presents with  . Respiratory Distress   - Patient had a rapid response this morning as patient care was being undertaken, just rolling over in bed and caused her to have an acute respiratory distress episode with tachycardia and heart rate in the 180s persistently. - Was on BiPAP  In ICU - Weaned off Bipap and on nasal canula, DNR now.  REVIEW OF SYSTEMS:  Review of Systems  Constitutional: Positive for malaise/fatigue. Negative for chills and fever.  HENT: Negative for congestion, ear discharge, hearing loss and nosebleeds.   Eyes: Negative for blurred vision.  Respiratory: Positive for cough, sputum production and shortness of breath. Negative for wheezing.   Cardiovascular: Positive for palpitations. Negative for chest pain and leg swelling.  Gastrointestinal: Positive for nausea and vomiting. Negative for abdominal pain, constipation and diarrhea.  Genitourinary: Negative for dysuria and urgency.  Musculoskeletal: Positive for myalgias.  Neurological: Negative for dizziness, speech change, focal weakness, seizures and headaches.  Psychiatric/Behavioral: Negative for depression. The patient is nervous/anxious.     DRUG ALLERGIES:  No Known Allergies  VITALS:  Blood pressure (!) 120/95, pulse 89, temperature 97.9 F (36.6 C), temperature source Axillary, resp. rate 16, height  (1.651 m), weight 65 kg (143 lb 4.8 oz), SpO2 91 %.  PHYSICAL EXAMINATION:  Physical Exam  GENERAL:  76 y.o.-year-old patient Sitting by the side of bed, appears to be in no distress EYES: Pupils equal, round, reactive to light and accommodation. No scleral icterus. Extraocular muscles intact.  HEENT: Head atraumatic, normocephalic. Oropharynx and nasopharynx clear.  NECK:  Supple, no jugular venous  distention. No thyroid enlargement, no tenderness.  LUNGS: some wheezing all over the lung fields, no rhonchi or rails. More calm today, on nasal canula. CARDIOVASCULAR: S1, S2 normal. No rubs, or gallops. 2/6 systolic murmur present. ABDOMEN: Soft, nontender, nondistended. Bowel sounds present. No organomegaly or mass.  EXTREMITIES: No pedal edema, cyanosis, or clubbing.  NEUROLOGIC: Cranial nerves II through XII are intact. Muscle strength 3-4/5 in all extremities. Sensation intact. Gait not checked. Global weakness noted. PSYCHIATRIC: The patient is alert and oriented x 2.  SKIN: No obvious rash, lesion, or ulcer.    LABORATORY PANEL:   CBC  Recent Labs Lab 06/02/17 0619  WBC 5.0  HGB 8.3*  HCT 25.6*  PLT 246   ------------------------------------------------------------------------------------------------------------------  Chemistries   Recent Labs Lab 06/06/17 0547  NA 147*  K 4.1  CL 110  CO2 28  GLUCOSE 147*  BUN 70*  CREATININE 1.53*  CALCIUM 9.3   ------------------------------------------------------------------------------------------------------------------  Cardiac Enzymes  Recent Labs Lab 06/02/17 0619  TROPONINI 0.03*   ------------------------------------------------------------------------------------------------------------------  RADIOLOGY:  No results found.  EKG:   Orders placed or performed during the hospital encounter of 06/01/17  . EKG 12-Lead  . EKG 12-Lead  . EKG 12-Lead  . EKG 12-Lead    ASSESSMENT AND PLAN:   76 year old female with past medical history significant for chronic respiratory failure secondary to COPD and combined CHF, on 2 L home oxygen at rest, CK D and hypertension presents from [facility] nursing home secondary to worsening shortness of breath  #1 acute on chronic hypoxic respiratory failure-secondary to COPD exacerbation and CHF exacerbation. - Initially required BiPAP, weaned off and placed on  BiPAP again - Monitor  in ICU -uses 2 L at the nursing home and on exertion uses up to 6 L at baseline -Continue steroids, cough medications. -Continue Lasix -On azithromycin , Blood cx had Coag neg staph. - also has systolic heart failure, echo with EF of 25%. Appreciate cardiology consult. And entresto started - Pulm discussed with pt and family, now DNR. - Off Bipap now, may be a candidate for comfort care.  #2 depression and anxiety-continue home medications. 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 Avoid nephrotoxins and monitor  #5 DVT prophylaxis heparin subcutaneous heparin   All the records are reviewed and case discussed with Care Management/Social Workerr. Management plans discussed with the patient, family and they are in agreement.  CODE STATUS: DNR  TOTAL TIME SPENT IN TAKING CARE OF THIS PATIENT: 35 minutes.   POSSIBLE D/C IN 3-4 DAYS, DEPENDING ON CLINICAL CONDITION.   Altamese Dilling M.D on 06/06/2017 at 9:16 PM  Between 7am to 6pm - Pager - [phone]  After 6pm go to [email or web address] - password [email or web address]  [facility]  Office  [phone]  CC: Primary care physician; System, Pcp Not In+

## 2017-06-06 NOTE — Consult Note (Signed)
WOC Nurse wound consult note Reason for Consult:wound vac Wound type:full thickness Pressure Injury POA: NA Measurement: see earlier note Wound bed: beefy red Drainage (amount, consistency, odor) scant  Periwound: intact Dressing procedure/placement/frequency: Patient had a panic attack at the mention of the dressing change and she had to be placed on bipap, heart rate 150's. Removed dressing, cleansed wound, applied one piece of black foam to wound bed, one used for bridge after covering skin with drape. Drape applied, TRAC attached, 125 mmHg continuous suction resumed, immediate seal achieved. We will follow this patient and remain available to this patient, nursing, and the medical and surgical teams. WOC will change dressing Friday.  Barnett Hatter, RN-C, WTA-C Wound Treatment Associate

## 2017-06-06 NOTE — Progress Notes (Signed)
Pt resting well on bipap, clam and cooperative. Daughter more clam  Now.

## 2017-06-06 NOTE — Plan of Care (Signed)
Problem: Health Behavior/Discharge Planning: Goal: Ability to manage health-related needs will improve Outcome: Not Progressing Patient still having to use Bipap intermittently with frequent bouts or panic/anxiety. Patient given meds as needed for anxiety and shortness of breath  Problem: Pain Managment: Goal: General experience of comfort will improve Outcome: Progressing Patient seen by Palliative NP this shift- to be started on Morphine drip   Problem: Physical Regulation: Goal: Will remain free from infection Outcome: Progressing No new signs or symptoms of infection noted. Dressing to right leg with wound vac changed by wound nurse today  Problem: Activity: Goal: Risk for activity intolerance will decrease Outcome: Not Progressing Patient did tolerate being off Bipap for approzx 4 hours today, patient was placed back on this afternoon for work of breathing, and shortness of breath  Problem: Bowel/Gastric: Goal: Will not experience complications related to bowel motility Outcome: Completed/Met Date Met: 06/06/17 Patient had a large bowel movement today

## 2017-06-06 NOTE — Progress Notes (Signed)
[facility] [place] Critical Care Medicine Consultation    FiO2 (%):  [40 %-50 %] 40 %  INTAKE / OUTPUT:  Intake/Output Summary (Last 24 hours) at 06/06/17 0803 Last data filed at 06/06/17 0600  Gross per 24 hour  Intake           557.31 ml  Output             1450 ml  Net          -892.69 ml   Name: Erica [name] MRN: [id number] DOB: 08-06-1941    ADMISSION DATE:  06/01/2017 CONSULTATION DATE:  06/03/2017  REFERRING MD :  hospitalist service  CHIEF COMPLAINT:  progressive shortness of breath Synopsis Erica [name] is a 76 year old African-American female with a past medical history remarkable for hypertension, hyperlipidemia, chronic renal insufficiency,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 She was subsequently moved to the intensive care unit and started on noninvasive ventilation. She was given benzodiazepine and is now being started on Precedex for air hunger. Presently she states she is feeling better, on noninvasive ventilation,still with labored breathing respiratory rate of 32 with accessory muscle utilization.  HPI Patient with resp distress On biPAP Family at bedside On precedex inusion DNR/DNI Only toerated several hrs off of biPAP Patient in pain Will need morphine     REVIEW OF SYSTEMS:    The remainder of systems were reviewed and were found to be negative other than what is documented in the HPI.    VITAL SIGNS: Temp:  [97.7 F (36.5 C)-99.2 F (37.3 C)] 97.7 F (36.5 C) (10/10 0500) Pulse Rate:  [53-89] 84 (10/10 0750) Resp:  [10-19] 18 (10/10 0750) BP: (91-126)/(45-79) 115/59 (10/10 0700) SpO2:  [96 %-100 %] 96 % (10/10 0750) FiO2 (%):  [40 %-50 %] 40 % (10/10 0750) HEMODYNAMICS:   VENTILATOR SETTINGS: FiO2 (%):  [40 %-50 %] 40 % INTAKE / OUTPUT:  Intake/Output Summary (Last 24 hours) at 06/06/17 0803 Last data  filed at 06/06/17 0600  Gross per 24 hour  Intake           557.31 ml  Output             1450 ml  Net          -892.69 ml    Physical Examination:   VS: BP (!) 115/59   Pulse 84   Temp 97.7 F (36.5 C) (Axillary)   Resp 18   Ht  (1.651 m)   Wt 143 lb 4.8 oz (65 kg)   SpO2 96%   BMI 23.85 kg/m   General Appearance: patient in respiratory distress on noninvasive ventilation in the intensive care  Neuro:Limited exam, no obvious focal deficits HEENT: PERRLA, EOM intact, no ptosis, no other lesions noticed;  Pulmonary: diffuse wheezing noted, prolonged expiratory phase clear bronchospasm appreciated Cardiovascular sinus tachycardia appreciated Abdomen: Benign, Soft, non-tender, No masses, hepatosplenomegaly, No lymphadenopathy Endoc: No evident thyromegaly, no signs of acromegaly. Skin:   warm, no rashes, no ecchymosis  Extremities: normal, no cyanosis, clubbing, no edema, warm with normal capillary refill.    LABS: Reviewed   LABORATORY PANEL:   CBC  Recent Labs Lab 06/02/17 0619  WBC 5.0  HGB 8.3*  HCT 25.6*  PLT 246    Chemistries   Recent Labs Lab 06/06/17 0547  NA 147*  K 4.1  CL 110  CO2 28  GLUCOSE 147*  BUN 70*  CREATININE 1.53*  CALCIUM 9.3     Recent Labs Lab 06/02/17 0731 06/03/17 0914 06/03/17 1813  GLUCAP 141* 183* 151*    Recent Labs Lab 06/03/17 0917 06/04/17 0810  PHART 7.32* 7.35  PCO2ART 52* 49*  PO2ART 79* 190*   No results for input(s): AST, ALT, ALKPHOS, BILITOT, ALBUMIN in the last 168 hours.  Cardiac Enzymes  Recent Labs Lab 06/02/17 0619  TROPONINI 0.03*    ASSESSMENT/PLAN   76 year old African-American  Female with acute on chronic respiratory failure due to acute COPD exacerbation as well as acute diastolic decompensation on noninvasive ventilation with encephalopathy  Respiratory distress. Acute hypercapnic respiratory failure on NIV, Solu-Medrol albuterol, Atrovent and azithromycin. We'll  continue to monitor closely, on Precedex and benzodiazepine for anxiety. If respiratory status worsens will proceed with intubation and mechanical ventilation-family updated.  Heart failure both systolic and diastolic. Patient is on Lasix and Entresto. Believe patient's respiratory failure is more consistent with COPD than heart failure at this time.Chest x-ray shows both hyperinflated lung fields with cardiomegaly  Sepsis. Blood cultures are consistent with methicillin-resistant staph aureus. We'll add vancomycin  Renal insufficiency. Will follow closely  Anemia.No evidence of active bleeding   Critical Care Time devoted to patient care services described in this note is 36 minutes.   Overall, patient is critically ill, prognosis is guarded.  Patient with Multiorgan failure and at high risk for cardiac arrest and death.  Patient is DNR/DNI-failed to come off of biPAP last night, will try again   Lucie Leather, M.D.  [facility]  Medical Director [facility] Medical Director [facility] Cardio-Pulmonary Department

## 2017-06-06 NOTE — Progress Notes (Signed)
Pt placed on [place] 4lpm.  Pt tolerating well at this time. HR 73 RR 12 oxygen saturations 91%.

## 2017-06-06 NOTE — Progress Notes (Signed)
Late entering. Pt struggling to breath, HT 120, breathing 30-35 bpm, pt very restless, agitated, daughter agitated and anxious too. Pt requesting to be placed back on Bipap. Morphine  ivp given as ordered without much relief. RT notified. Pt was placed back on Bipap 50%Fio2. Will cont to monitor.

## 2017-06-06 NOTE — Clinical Social Work Note (Signed)
CSW is following patient and is still able to return to [facility] once stable. York Spaniel MSW,LCSW [phone]

## 2017-06-06 NOTE — Progress Notes (Signed)
[employer] [facility] [place] [facility]   PATIENT NAME: Erica [name]    MR#:  [id number]  DATE OF BIRTH:  June 02, 1941  SUBJECTIVE:  CHIEF COMPLAINT:   Chief Complaint  Patient presents with  . Respiratory Distress   - Patient had a rapid response this morning as patient care was being undertaken, just rolling over in bed and caused her to have an acute respiratory distress episode with tachycardia and heart rate in the 180s persistently. - Currently on BiPAP  In ICU - Could not wean off Bipap yet, DNR now.  REVIEW OF SYSTEMS:  Review of Systems  Constitutional: Positive for malaise/fatigue. Negative for chills and fever.  HENT: Negative for congestion, ear discharge, hearing loss and nosebleeds.   Eyes: Negative for blurred vision.  Respiratory: Positive for cough, sputum production and shortness of breath. Negative for wheezing.   Cardiovascular: Positive for palpitations. Negative for chest pain and leg swelling.  Gastrointestinal: Positive for nausea and vomiting. Negative for abdominal pain, constipation and diarrhea.  Genitourinary: Negative for dysuria and urgency.  Musculoskeletal: Positive for myalgias.  Neurological: Negative for dizziness, speech change, focal weakness, seizures and headaches.  Psychiatric/Behavioral: Negative for depression. The patient is nervous/anxious.     DRUG ALLERGIES:  No Known Allergies  VITALS:  Blood pressure (!) 115/59, pulse 89, temperature 97.7 F (36.5 C), temperature source Axillary, resp. rate 17, height  (1.651 m), weight 65 kg (143 lb 4.8 oz), SpO2 97 %.  PHYSICAL EXAMINATION:  Physical Exam  GENERAL:  76 y.o.-year-old patient Sitting by the side of bed, appears to be in no distress EYES: Pupils equal, round, reactive to light and accommodation. No scleral icterus. Extraocular muscles intact.  HEENT: Head atraumatic, normocephalic. Oropharynx and nasopharynx clear.  NECK:  Supple, no jugular venous  distention. No thyroid enlargement, no tenderness.  LUNGS: Scant breath sounds with diffuse coarse wheezing all over the lung fields, no rhonchi or rails. Using accessory muscles to breathe on bipap CARDIOVASCULAR: S1, S2 normal. No rubs, or gallops. 2/6 systolic murmur present. ABDOMEN: Soft, nontender, nondistended. Bowel sounds present. No organomegaly or mass.  EXTREMITIES: No pedal edema, cyanosis, or clubbing.  NEUROLOGIC: Cranial nerves II through XII are intact. Muscle strength 3-4/5 in all extremities. Sensation intact. Gait not checked. Global weakness noted. PSYCHIATRIC: The patient is alert and oriented x 2.  SKIN: No obvious rash, lesion, or ulcer.    LABORATORY PANEL:   CBC  Recent Labs Lab 06/02/17 0619  WBC 5.0  HGB 8.3*  HCT 25.6*  PLT 246   ------------------------------------------------------------------------------------------------------------------  Chemistries   Recent Labs Lab 06/06/17 0547  NA 147*  K 4.1  CL 110  CO2 28  GLUCOSE 147*  BUN 70*  CREATININE 1.53*  CALCIUM 9.3   ------------------------------------------------------------------------------------------------------------------  Cardiac Enzymes  Recent Labs Lab 06/02/17 0619  TROPONINI 0.03*   ------------------------------------------------------------------------------------------------------------------  RADIOLOGY:  No results found.  EKG:   Orders placed or performed during the hospital encounter of 06/01/17  . EKG 12-Lead  . EKG 12-Lead  . EKG 12-Lead  . EKG 12-Lead    ASSESSMENT AND PLAN:   76 year old female with past medical history significant for chronic respiratory failure secondary to COPD and combined CHF, on 2 L home oxygen at rest, CK D and hypertension presents from [facility] nursing home secondary to worsening shortness of breath  #1 acute on chronic hypoxic respiratory failure-secondary to COPD exacerbation and CHF exacerbation. - Initially  required BiPAP, weaned off and placed  on BiPAP again - Monitor in ICU -uses 2 L at the nursing home and on exertion uses up to 6 L at baseline -Continue steroids, cough medications. -Continue Lasix -On azithromycin , Blood cx had Coag neg staph. - also has systolic heart failure, echo with EF of 25%. Appreciate cardiology consult. And entresto started - Pulm discussed with pt and family, now DNR. - Could not wean from Bipap.  #2 depression and anxiety-continue home medications. 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 Avoid nephrotoxins and monitor  #5 DVT prophylaxis heparin subcutaneous heparin   All the records are reviewed and case discussed with Care Management/Social Workerr. Management plans discussed with the patient, family and they are in agreement.  CODE STATUS: DNR  TOTAL TIME SPENT IN TAKING CARE OF THIS PATIENT: 35 minutes.   POSSIBLE D/C IN 3-4 DAYS, DEPENDING ON CLINICAL CONDITION.   Altamese Dilling M.D on 06/06/2017 at 7:36 AM  Between 7am to 6pm - Pager - [phone]  After 6pm go to [email or web address] - password [email or web address]  [facility] [place] [facility]  Office  [phone]  CC: Primary care physician; System, Pcp Not In

## 2017-06-07 MED ORDER — SENNA 8.6 MG PO TABS
1.0000 | ORAL_TABLET | Freq: Every day | ORAL | Status: DC
  Administered 2017-06-07 – 2017-06-08 (×2): 8.6 mg via ORAL
  Filled 2017-06-07 (×4): qty 1

## 2017-06-07 MED ORDER — CARVEDILOL 3.125 MG PO TABS: 6.2500 mg | ORAL_TABLET | Freq: Two times a day (BID) | ORAL | Status: DC

## 2017-06-07 MED ORDER — MORPHINE SULFATE (CONCENTRATE) 10 MG/0.5ML PO SOLN
5.0000 mg | ORAL | Status: DC
  Administered 2017-06-07 – 2017-06-08 (×5): 5 mg via SUBLINGUAL
  Filled 2017-06-07 (×5): qty 1

## 2017-06-07 MED ORDER — MORPHINE SULFATE (CONCENTRATE) 10 MG/0.5ML PO SOLN
10.0000 mg | ORAL | Status: DC | PRN
  Administered 2017-06-07 – 2017-06-08 (×2): 10 mg via SUBLINGUAL
  Filled 2017-06-07: qty 1

## 2017-06-07 MED ORDER — HALOPERIDOL LACTATE 5 MG/ML IJ SOLN: 2.0000 mg | Freq: Four times a day (QID) | INTRAMUSCULAR | Status: DC | PRN

## 2017-06-07 NOTE — Progress Notes (Signed)
New hospice home referral received from CSW [facility]. Writer reviewed chart notes and spoke with patient's daughter Okey Regal. Okey Regal was concerned that her mother had not been told she was going to the hospice home, she also felt that she needed to speak with her sisters again about the discharge plan. She indicated that her "baby sister" Pincus Sanes, was the [facility] and would need to sign paperwork. Support given. Writer assured Okey Regal that a plan could be worked out. Contact information left with Okey Regal. Patient at is currently on a continuous morphine drip and has been weaned off Bipap to 4 liters of oxygen via nasal cannula.  Writer spoke again at length with Palliative NP Vennie Homans after meeting with Okey Regal, she plans to re-engage and help clarify discharge plan.  CSW [employer] also updated. Thank you. Dayna Barker RN, BSN, [facility] [place], [facility] [phone] c

## 2017-06-07 NOTE — Progress Notes (Signed)
Follow-up with daughters, Okey Regal and Lupita Leash. Pincus Sanes is at work till after [employer]. Again discussed hospice options. Lupita Leash is now interested in taking the patient home with hospice services and is "asking around" to find caregivers to help. Lupita Leash tells me her mother has spoken of wanting to "die at home surrounded by family." Encouraged Lupita Leash and Okey Regal to speak with Pincus Sanes tonight regarding their decision with hospice options.   Patient awake and alert on morphine infusion. She has needed minimal prn bolus doses. I will discontinue morphine continuous infusion and schedule Roxanol  SL q4h. She can have prn roxanol for breakthrough pain/dyspnea. I will also continue prn ativan and haldol.  NO CHARGE  Vennie Homans, FNP-C Palliative Medicine Team  Phone: [phone] Fax: [phone]

## 2017-06-07 NOTE — Progress Notes (Signed)
Clinical Social Worker (CSW) received residential hospice consult from palliative NP. CSW met with patient and her daughter Arbie Cookey [phone] was at bedside. Arbie Cookey reported that she is a [employer] hospice and the hospice facility on [address] in [place] is like her second home. Per Arbie Cookey she would like for patient to go to the hospice facility in [place]. Per Arbie Cookey patient has 5 adult children and she believes her sister Anitra Lauth is HPOA. Per Arbie Cookey all patient's adult children agree on hospice except for 1 son that lives out of state. Per Arbie Cookey the son is not coming into town until next week and they have alerted security at the hospital in case he causes a scene.  Santiago Glad [place] [place] hospice liaison is aware of above and will start referral process.   [employer], LCSW [phone]

## 2017-06-07 NOTE — Progress Notes (Signed)
Pnt currently resting and appears comfortable. Pnt had episode of appearing anxious and not wanting to sleep. Pnt wanted someone to hold her hand. Pnt easily comforted and redirected.Pnts daughter at bedside. No other issues or concerns at this time. Will continue to monitor and assess.

## 2017-06-07 NOTE — Progress Notes (Signed)
[employer] - [place] at [facility]   PATIENT NAME: Erica [name]    MR#:  [id number]  DATE OF BIRTH:  [birth year]  SUBJECTIVE:  CHIEF COMPLAINT:   Chief Complaint  Patient presents with  . Respiratory Distress   - Was on BiPAP  In ICU - Weaned off Bipap and on nasal canula, DNR now. On comfort measures on morphine iV drip.  REVIEW OF SYSTEMS:  Review of Systems  Constitutional: Positive for malaise/fatigue. Negative for chills and fever.  HENT: Negative for congestion, ear discharge, hearing loss and nosebleeds.   Eyes: Negative for blurred vision.  Respiratory: Positive for cough, sputum production and shortness of breath. Negative for wheezing.   Cardiovascular: Positive for palpitations. Negative for chest pain and leg swelling.  Gastrointestinal: Positive for nausea and vomiting. Negative for abdominal pain, constipation and diarrhea.  Genitourinary: Negative for dysuria and urgency.  Musculoskeletal: Positive for myalgias.  Neurological: Negative for dizziness, speech change, focal weakness, seizures and headaches.  Psychiatric/Behavioral: Negative for depression. The patient is nervous/anxious.     DRUG ALLERGIES:  No Known Allergies  VITALS:  Blood pressure (!) 146/78, pulse 86, temperature 98 F (36.7 C), temperature source Oral, resp. rate 16, height  (1.651 m), weight 65 kg (143 lb 4.8 oz), SpO2 100 %.  PHYSICAL EXAMINATION:  Physical Exam  GENERAL:  76 y.o.-year-old patient Sitting by the side of bed, appears comfortable. EYES: Pupils equal, round, reactive to light and accommodation. No scleral icterus. Extraocular muscles intact.  HEENT: Head atraumatic, normocephalic. Oropharynx and nasopharynx clear.  NECK:  Supple, no jugular venous distention. No thyroid enlargement, no tenderness.  LUNGS: some wheezing all over the lung fields, no rhonchi or rails. More calm today, on nasal canula. CARDIOVASCULAR: S1, S2 normal. No rubs, or  gallops. 2/6 systolic murmur present. ABDOMEN: Soft, nontender, nondistended. Bowel sounds present. No organomegaly or mass.  EXTREMITIES: No pedal edema, cyanosis, or clubbing.  NEUROLOGIC: Cranial nerves II through XII are intact. Muscle strength 3-4/5 in all extremities. Sensation intact. Gait not checked. Global weakness noted. PSYCHIATRIC: The patient is alert and oriented x 3.  SKIN: No obvious rash, lesion, or ulcer.    LABORATORY PANEL:   CBC  Recent Labs Lab 06/02/17 0619  WBC 5.0  HGB 8.3*  HCT 25.6*  PLT 246   ------------------------------------------------------------------------------------------------------------------  Chemistries   Recent Labs Lab 06/06/17 0547  NA 147*  K 4.1  CL 110  CO2 28  GLUCOSE 147*  BUN 70*  CREATININE 1.53*  CALCIUM 9.3   ------------------------------------------------------------------------------------------------------------------  Cardiac Enzymes  Recent Labs Lab 06/02/17 0619  TROPONINI 0.03*   ------------------------------------------------------------------------------------------------------------------  RADIOLOGY:  No results found.  EKG:   Orders placed or performed during the hospital encounter of 06/01/17  . EKG 12-Lead  . EKG 12-Lead  . EKG 12-Lead  . EKG 12-Lead    ASSESSMENT AND PLAN:   76 year old female with past medical history significant for chronic respiratory failure secondary to COPD and combined CHF, on 2 L home oxygen at rest, CK D and hypertension presents from [facility] nursing home secondary to worsening shortness of breath  #1 acute on chronic hypoxic respiratory failure-secondary to COPD exacerbation and CHF exacerbation. - Initially required BiPAP, weaned off and placed on BiPAP again - Monitored in ICU -uses 2 L at the nursing home and on exertion uses up to 6 L at baseline -Continue steroids, cough medications. -Continue Lasix -On azithromycin , Blood cx had Coag  neg staph. -  also has systolic heart failure, echo with EF of 25%. Appreciate cardiology consult. And entresto started - Pulm discussed with pt and family, now DNR. - Off Bipap now, on comfort care. Morphine drip.  #2 depression and anxiety-continue home medications. 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 Avoid nephrotoxins and monitor  #5 DVT prophylaxis heparin subcutaneous heparin   All the records are reviewed and case discussed with Care Management/Social Workerr. Management plans discussed with the patient, family and they are in agreement.  CODE STATUS: DNR  TOTAL TIME SPENT IN TAKING CARE OF THIS PATIENT: 35 minutes.   POSSIBLE D/C IN 1-2 DAYS, DEPENDING ON CLINICAL CONDITION.   Altamese Dilling M.D on 06/07/2017 at 5:28 PM  Between 7am to 6pm - Pager - [phone]  After 6pm go to [email or web address] - password [employer]  [facility] [place] [facility]  Office  [phone]  CC: Primary care physician; System, Pcp Not In+

## 2017-06-07 NOTE — Progress Notes (Signed)
SUBJECTIVE:Pt sitting in bed and eating. No chest pain. Breathing is slightly improved.    Vitals:   06/07/17 0500 06/07/17 0600 06/07/17 0700 06/07/17 0734  BP: (!) 142/63 132/68 (!) 152/71   Pulse: 91 94 99   Resp: Temp:    98.2 F (36.8 C)  TempSrc:    Oral  SpO2: 94% 92% 92%   Weight:      Height:        Intake/Output Summary (Last 24 hours) at 06/07/17 0827 Last data filed at 06/07/17 0500  Gross per 24 hour  Intake           554.98 ml  Output              550 ml  Net             4.98 ml    LABS: Basic Metabolic Panel:  Recent Labs  16/10/96 0547  NA 147*  K 4.1  CL 110  CO2 28  GLUCOSE 147*  BUN 70*  CREATININE 1.53*  CALCIUM 9.3   Liver Function Tests: No results for input(s): AST, ALT, ALKPHOS, BILITOT, PROT, ALBUMIN in the last 72 hours. No results for input(s): LIPASE, AMYLASE in the last 72 hours. CBC: No results for input(s): WBC, NEUTROABS, HGB, HCT, MCV, PLT in the last 72 hours. Cardiac Enzymes: No results for input(s): CKTOTAL, CKMB, CKMBINDEX, TROPONINI in the last 72 hours. BNP: Invalid input(s): POCBNP D-Dimer: No results for input(s): DDIMER in the last 72 hours. Hemoglobin A1C: No results for input(s): HGBA1C in the last 72 hours. Fasting Lipid Panel: No results for input(s): CHOL, HDL, LDLCALC, TRIG, CHOLHDL, LDLDIRECT in the last 72 hours. Thyroid Function Tests: No results for input(s): TSH, T4TOTAL, T3FREE, THYROIDAB in the last 72 hours.  Invalid input(s): FREET3 Anemia Panel: No results for input(s): VITAMINB12, FOLATE, FERRITIN, TIBC, IRON, RETICCTPCT in the last 72 hours.   PHYSICAL EXAM General: Well developed, well nourished, in no acute distress HEENT:  Normocephalic and atramatic Neck:  No JVD.  Lungs: Clear bilaterally to auscultation and percussion. Heart: HRRR . Normal S1 and S2 without gallops or murmurs.  Abdomen: Bowel sounds are positive, abdomen soft and non-tender  Msk:  Back normal, normal gait.  Normal strength and tone for age. Extremities: No clubbing, cyanosis or edema.   Neuro: Alert and oriented X 3. Psych:  Good affect, responds appropriately  TELEMETRY: NSR 97bpm  ASSESSMENT AND PLAN: Respiratory failure secondary to chronic COPD and CHF. Blood pressures is stable. Continue Entresto and lasix. Will increase coreg to 6.25 BID. Will continue to follow.  Principal Problem:   COPD with acute exacerbation (HCC) Active Problems:   HTN (hypertension)   HLD (hyperlipidemia)   Acute on chronic combined systolic and diastolic CHF (congestive heart failure) (HCC)   CKD (chronic kidney disease), stage IV (HCC)   Respiratory distress   Anxiety state   Palliative care by specialist   Goals of care, counseling/discussion    Erica Hamman, NP-C 06/07/2017 8:27 AM
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
## 2017-06-07 NOTE — Plan of Care (Signed)
Problem: Education: Goal: Knowledge of the prescribed therapeutic regimen will improve Outcome: Progressing Pt transferred today from ICU. Pt on comfort care. No signs of distress. Daughter at the bedside.

## 2017-06-07 NOTE — Progress Notes (Signed)
Pt refuses foley. Vennie Homans, NP is aware.

## 2017-06-08 MED ORDER — MORPHINE SULFATE (CONCENTRATE) 10 MG/0.5ML PO SOLN
5.0000 mg | Freq: Four times a day (QID) | ORAL | Status: DC
  Administered 2017-06-08 – 2017-06-09 (×5): 5 mg via SUBLINGUAL
  Filled 2017-06-08 (×5): qty 1

## 2017-06-08 MED ORDER — MIRTAZAPINE 15 MG PO TABS
15.0000 mg | ORAL_TABLET | Freq: Every day | ORAL | Status: DC
  Administered 2017-06-08: 22:00:00 15 mg via ORAL
  Filled 2017-06-08: qty 1

## 2017-06-08 MED ORDER — CARVEDILOL 6.25 MG PO TABS
6.2500 mg | ORAL_TABLET | Freq: Two times a day (BID) | ORAL | Status: DC
  Administered 2017-06-08 – 2017-06-09 (×2): 6.25 mg via ORAL
  Filled 2017-06-08: qty 1
  Filled 2017-06-08: qty 2
  Filled 2017-06-08: qty 1
  Filled 2017-06-08: qty 2

## 2017-06-08 MED ORDER — SACUBITRIL-VALSARTAN 24-26 MG PO TABS
1.0000 | ORAL_TABLET | Freq: Two times a day (BID) | ORAL | Status: DC
  Administered 2017-06-08: 1 via ORAL
  Filled 2017-06-08 (×3): qty 1

## 2017-06-08 MED ORDER — PANTOPRAZOLE SODIUM 40 MG PO TBEC
40.0000 mg | DELAYED_RELEASE_TABLET | Freq: Every day | ORAL | Status: DC
  Administered 2017-06-08: 40 mg via ORAL
  Filled 2017-06-08 (×2): qty 1

## 2017-06-08 MED ORDER — CITALOPRAM HYDROBROMIDE 20 MG PO TABS
20.0000 mg | ORAL_TABLET | Freq: Every day | ORAL | Status: DC
Start: 2017-06-08 — End: 2017-06-09
  Administered 2017-06-08 – 2017-06-09 (×2): 20 mg via ORAL
  Filled 2017-06-08 (×2): qty 1

## 2017-06-08 MED ORDER — FENTANYL 12 MCG/HR TD PT72
12.5000 ug | MEDICATED_PATCH | TRANSDERMAL | Status: DC
  Administered 2017-06-09: 13:00:00 12.5 ug via TRANSDERMAL
  Filled 2017-06-08: qty 1

## 2017-06-08 MED ORDER — BUSPIRONE HCL 15 MG PO TABS
7.5000 mg | ORAL_TABLET | Freq: Three times a day (TID) | ORAL | Status: DC
  Administered 2017-06-08 – 2017-06-09 (×3): 7.5 mg via ORAL
  Filled 2017-06-08 (×4): qty 1

## 2017-06-08 MED ORDER — ROFLUMILAST 500 MCG PO TABS
500.0000 ug | ORAL_TABLET | Freq: Every day | ORAL | Status: DC
  Administered 2017-06-08: 18:00:00 500 ug via ORAL
  Filled 2017-06-08 (×2): qty 1

## 2017-06-08 MED ORDER — TIOTROPIUM BROMIDE MONOHYDRATE 18 MCG IN CAPS
18.0000 ug | ORAL_CAPSULE | Freq: Every day | RESPIRATORY_TRACT | Status: DC
  Administered 2017-06-08: 18 ug via RESPIRATORY_TRACT
  Filled 2017-06-08: qty 5

## 2017-06-08 NOTE — Progress Notes (Signed)
[facility] [place] [facility]   PATIENT NAME: Erica [name]    MR#:  [id number]  DATE OF BIRTH:  06-25-1941  SUBJECTIVE:  CHIEF COMPLAINT:   Chief Complaint  Patient presents with  . Respiratory Distress   - Was on BiPAP  In ICU - Weaned off Bipap and on nasal canula, DNR now. On comfort measures on oral roxanol.  REVIEW OF SYSTEMS:  Review of Systems  Constitutional: Positive for malaise/fatigue. Negative for chills and fever.  HENT: Negative for congestion, ear discharge, hearing loss and nosebleeds.   Eyes: Negative for blurred vision.  Respiratory: Positive for cough, sputum production and shortness of breath. Negative for wheezing.   Cardiovascular: Positive for palpitations. Negative for chest pain and leg swelling.  Gastrointestinal: Positive for nausea and vomiting. Negative for abdominal pain, constipation and diarrhea.  Genitourinary: Negative for dysuria and urgency.  Musculoskeletal: Positive for myalgias.  Neurological: Negative for dizziness, speech change, focal weakness, seizures and headaches.  Psychiatric/Behavioral: Negative for depression. The patient is nervous/anxious.     DRUG ALLERGIES:  No Known Allergies  VITALS:  Blood pressure (!) 108/58, pulse (!) 113, temperature 98.2 F (36.8 C), temperature source Oral, resp. rate 20, height  (1.651 m), weight 65 kg (143 lb 4.8 oz), SpO2 99 %.  PHYSICAL EXAMINATION:  Physical Exam  GENERAL:  76 y.o.-year-old patient Sitting by the side of bed, appears comfortable. EYES: Pupils equal, round, reactive to light and accommodation. No scleral icterus. Extraocular muscles intact.  HEENT: Head atraumatic, normocephalic. Oropharynx and nasopharynx clear.  NECK:  Supple, no jugular venous distention. No thyroid enlargement, no tenderness.  LUNGS: some wheezing all over the lung fields, no rhonchi or rails. More calm today, on nasal canula. CARDIOVASCULAR: S1, S2 normal. No rubs, or  gallops. 2/6 systolic murmur present. ABDOMEN: Soft, nontender, nondistended. Bowel sounds present. No organomegaly or mass.  EXTREMITIES: No pedal edema, cyanosis, or clubbing.  NEUROLOGIC: Cranial nerves II through XII are intact. Muscle strength 3-4/5 in all extremities. Sensation intact. Gait not checked. Global weakness noted. PSYCHIATRIC: The patient is alert and oriented x 3.  SKIN: No obvious rash, lesion, or ulcer.    LABORATORY PANEL:   CBC  Recent Labs Lab 06/02/17 0619  WBC 5.0  HGB 8.3*  HCT 25.6*  PLT 246   ------------------------------------------------------------------------------------------------------------------  Chemistries   Recent Labs Lab 06/06/17 0547  NA 147*  K 4.1  CL 110  CO2 28  GLUCOSE 147*  BUN 70*  CREATININE 1.53*  CALCIUM 9.3   ------------------------------------------------------------------------------------------------------------------  Cardiac Enzymes  Recent Labs Lab 06/02/17 0619  TROPONINI 0.03*   ------------------------------------------------------------------------------------------------------------------  RADIOLOGY:  No results found.  EKG:   Orders placed or performed during the hospital encounter of 06/01/17  . EKG 12-Lead  . EKG 12-Lead  . EKG 12-Lead  . EKG 12-Lead    ASSESSMENT AND PLAN:   76 year old female with past medical history significant for chronic respiratory failure secondary to COPD and combined CHF, on 2 L home oxygen at rest, CK D and hypertension presents from [facility] nursing home secondary to worsening shortness of breath  #1 acute on chronic hypoxic respiratory failure-secondary to COPD exacerbation and CHF exacerbation. - Initially required BiPAP, weaned off and placed on BiPAP again - Monitored in ICU -uses 2 L at the nursing home and on exertion uses up to 6 L at baseline -Continue steroids, cough medications. -Continue Lasix -On azithromycin , Blood cx had Coag  neg staph. -  also has systolic heart failure, echo with EF of 25%. Appreciate cardiology consult. And entresto started - Pulm discussed with pt and family, now DNR. - Off Bipap now, on comfort care. Morphine drip d/c as plan for d/c home    On oral morphine. - Once hospital bed arranged at home, may go home with hospice.  #2 depression and anxiety-continue home medications. 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 Avoid nephrotoxins and monitor  #5 DVT prophylaxis heparin subcutaneous heparin   All the records are reviewed and case discussed with Care Management/Social Workerr. Management plans discussed with the patient, family and they are in agreement.  CODE STATUS: DNR  TOTAL TIME SPENT IN TAKING CARE OF THIS PATIENT: 35 minutes.   POSSIBLE D/C IN 1-2 DAYS, DEPENDING ON CLINICAL CONDITION.   Altamese Dilling M.D on 06/08/2017 at 2:43 PM  Between 7am to 6pm - Pager - [phone]  After 6pm go to [email or web address] - password [email or web address]  [facility]  Office  [phone]  CC: Primary care physician; System, Pcp Not In+

## 2017-06-08 NOTE — Progress Notes (Signed)
Writer met with both of patient's daughters Langley Gauss and Arbie Cookey separately and together. The plan is for patient to discharge HOME. She will need a hospital bed in place PRIOR to discharge. This has been ordered for delivery tomorrow as family needs to move furniture. Patient will require EMS transport with signed out of facility DNR. [facility] updated to discharge plan. Patient information faxed to referral. Hospice contact number and information left with Arbie Cookey. Thank you. Flo Shanks RN, BSN, [facility] [place], hospital Liaison [phone] c

## 2017-06-08 NOTE — Consult Note (Signed)
WOC Nurse wound follow up Wound type:NPWT in place.  Will switch over to topical therapy.  Is too shallow for sponge to remain in wound bed.  Will switch to topical therapy at this time.   Measurement:3 cm x 0.4 cm x 0.3 cm  Raised induration noted at 3 o'clock.  Palliative NP, Megan at bedside and assessed as well.  There is no erythema, or drainage.   Wound ZOX:WRUEA red Drainage (amount, consistency, odor) none Periwound:intact Dressing procedure/placement/frequency:Cleanse wound to right leg with NS.  Apply calcium alginate to wound bed.  Cover with dry dressing and kerlix.  Change daily.  Will not follow at this time.  Please re-consult if needed.  Maple Hudson RN BSN CWON Pager [phone]

## 2017-06-08 NOTE — Progress Notes (Signed)
Daily Progress Note   Patient Name: Erica [name]       Date: 06/08/2017 DOB: 1941/04/26  Age: 76 y.o. MRN#: [id number] Attending Physician: Altamese Dilling, * Primary Care Physician: System, Pcp Not In Admit Date: 06/01/2017  Reason for Consultation/Follow-up: Establishing goals of care  Subjective/GOC: Patient awake and alert this morning. Irritable. States relief from scheduled morphine.   Daughter, Erica [name], at bedside. Speaks of her mother being intermittently agitated through the night. We discussed prn medications for agitation and possible hospital delirium. Erica [name] does feel the morphine is helping her shortness of breath.   Also spoke with Pincus Sanes this morning via telephone. She requests my assistance with FMLA paperwork.   The family has decided to take [name] home with hospice services. She lives with Erica [name]. Erica [name] is arranging caregivers to stay with her mother while she is working.   Answered questions and concerns.   Length of Stay: 7  Current Medications: Scheduled Meds:  . busPIRone  7.5 mg Oral TID  . carvedilol  6.25 mg Oral BID WC  . citalopram  20 mg Oral Daily  . [START ON 06/09/2017] fentaNYL  12.5 mcg Transdermal Q72H  . mouth rinse  15 mL Mouth Rinse BID  . mirtazapine  15 mg Oral QHS  . morphine CONCENTRATE  5 mg Sublingual Q6H  . pantoprazole  40 mg Oral Daily  . roflumilast  500 mcg Oral Daily  . sacubitril-valsartan  1 tablet Oral BID  . senna  1 tablet Oral Daily  . tiotropium  18 mcg Inhalation Daily    Continuous Infusions:   PRN Meds: acetaminophen **OR** acetaminophen, albuterol, glycopyrrolate, haloperidol lactate, LORazepam, morphine CONCENTRATE, ondansetron **OR** ondansetron (ZOFRAN) IV  Physical Exam  Constitutional: She is  cooperative. She appears ill.  HENT:  Head: Normocephalic and atraumatic.  Pulmonary/Chest: No accessory muscle usage. No tachypnea. No respiratory distress. She has decreased breath sounds.  Comfortable on 4L [place]  Abdominal: Normal appearance.  Neurological: She is alert.  Periods of confusion/delirium  Skin: Skin is warm and dry.  Nursing note and vitals reviewed.          Vital Signs: BP (!) 108/58   Pulse (!) 113   Temp 98.2 F (36.8 C) (Oral)   Resp 20   Ht  (1.651 m)   Wt 65  kg (143 lb 4.8 oz)   SpO2 99%   BMI 23.85 kg/m  SpO2: SpO2: 99 % O2 Device: O2 Device: Nasal Cannula O2 Flow Rate: O2 Flow Rate (L/min): 4 L/min  Intake/output summary:  No intake or output data in the 24 hours ending 06/08/17 0942 LBM: Last BM Date: (P) 06/07/17 Baseline Weight: Weight: 76.4 kg (168 lb 6.9 oz) Most recent weight: Weight: 65 kg (143 lb 4.8 oz)       Palliative Assessment/Data: PPS 30%   Flowsheet Rows     Most Recent Value  Intake Tab  Referral Department  Critical care  Unit at Time of Referral  ICU  Palliative Care Primary Diagnosis  Other (Comment) [COPD, CHF, acute on chronic respiratory failure]  Palliative Care Type  New Palliative care  Reason for referral  Clarify Goals of Care  Date first seen by Palliative Care  06/05/17  Clinical Assessment  Palliative Performance Scale Score  30%  Psychosocial & Spiritual Assessment  Palliative Care Outcomes  Patient/Family meeting held?  Yes  Who was at the meeting?  patient and daughter  Palliative Care Outcomes  Improved pain interventions, Improved non-pain symptom therapy, Clarified goals of care, Provided end of life care assistance, Provided psychosocial or spiritual support, ACP counseling assistance, Counseled regarding hospice, Transitioned to hospice      Patient Active Problem List   Diagnosis Date Noted  . Respiratory distress   . Anxiety state   . Palliative care by specialist   . Goals of care,  counseling/discussion   . COPD with acute exacerbation (HCC) 06/01/2017  . HTN (hypertension) 06/01/2017  . HLD (hyperlipidemia) 06/01/2017  . Acute on chronic combined systolic and diastolic CHF (congestive heart failure) (HCC) 06/01/2017  . CKD (chronic kidney disease), stage IV (HCC) 06/01/2017    Palliative Care Assessment & Plan   Patient Profile: 76 y.o. female with past medical history of COPD on home oxygen, systolic and diastolic congestive heart failure, hypertension, hyperlipidemia, chronic renal insufficiency admitted on 06/01/2017 with respiratory distress. In ED, patient hypoxic requiring BiPAP. She was able to wean off BiPAP. Workup consistent with COPD and CHF exacerbations. On 10/7, rapid response called for increased work of breathing and respiratory distress. Transferred to ICU for BiPAP and Precedex infusion initiated. Patient receiving lasix and entresto. Cardiology following. Blood cultures positive for methicillin-resistant staph aureus. Palliative medicine consultation for goals of care.   Assessment: Acute on chronic respiratory failure COPD exacerbation CHF exacerbation Sepsis Anxiety Renal insufficiency Anemia  Recommendations/Plan:  Comfort focused care. Patient/family state relief from scheduled Roxanol.  Continue Roxanol  SL q6h. May have prn doses for pain/dyspnea.  Continue Fentanyl 12.70mcg patch q72h.   Continue prn ativan/haldol.  Family has decided to take her home with hospice services. Arranging caregivers. RN CM aware.  Restarted some home medications. Patient able to take pills. May hold if she refuses.  Hospice liaison to meet with family today.  Code Status: DNR   Code Status Orders        Start     Ordered   06/04/17 1225  Do not attempt resuscitation (DNR)  Continuous    Question Answer Comment  In the event of cardiac or respiratory ARREST Do not call a "code blue"   In the event of cardiac or respiratory ARREST Do not  perform Intubation, CPR, defibrillation or ACLS   In the event of cardiac or respiratory ARREST Use medication by any route, position, wound care, and other measures to relive pain  and suffering. May use oxygen, suction and manual treatment of airway obstruction as needed for comfort.      06/04/17 1224    Code Status History    Date Active Date Inactive Code Status Order ID Comments User Context   06/02/2017 12:18 AM 06/04/2017 12:24 PM Full Code [id number]  Oralia Manis, MD Inpatient    Advance Directive Documentation     Most Recent Value  Type of Advance Directive  Healthcare Power of Attorney  Pre-existing out of facility DNR order (yellow form or pink MOST form)  -  "MOST" Form in Place?  -       Prognosis:   Unable to determine: poor prognosis with acute on chronic respiratory failure secondary to end-stage COPD and CHF exacerbation.   Discharge Planning:  To Be Determined likely home with hospice services  Care plan was discussed with patient, daughters Pincus Sanes and Erica [name]), RN, RN CM, hospice liaison, and Dr. Elisabeth Pigeon  Thank you for allowing the Palliative Medicine Team to assist in the care of this patient.   Time In: 0815 Time Out: 0930 Total Time Prolonged Time Billed yes      Greater than 50%  of this time was spent counseling and coordinating care related to the above assessment and plan.  Vennie Homans, FNP-C Palliative Medicine Team  Phone: [phone] Fax: [phone]  Please contact Palliative Medicine Team phone at [phone] for questions and concerns.

## 2017-06-08 NOTE — Care Management (Signed)
Spoke with palliative Care representative Vennie Homans NP. States that the family would like to take their mother home with Hospice services in place. Ms. Rockhold lives in [place]). Will update Dayna Barker RN representative for [employer].  Spoke with Lupita Leash and discussed hospice agencies. Would like [facility] [address], if they come to [facility]. If [place] [facility] doesn't go to [place] will consider [facility] or [facility]. Gwenette Greet RN MSN CCM Care Management [phone]

## 2017-06-08 NOTE — Progress Notes (Signed)
SUBJECTIVE: Pt is resting comfortably, daughter at bedside. She reports some stomach pain.    Vitals:   06/07/17 1014 06/07/17 1052 06/08/17 0424 06/08/17 0836  BP: (!) 146/78  (!) 130/47 (!) 108/58  Pulse: 90 86 (!) 111 (!) 113  Resp:  Temp: 98 F (36.7 C)  97.9 F (36.6 C) 98.2 F (36.8 C)  TempSrc: Oral  Oral Oral  SpO2: 100% 100% 100% 99%  Weight:      Height:       No intake or output data in the 24 hours ending 06/08/17 1159  LABS: Basic Metabolic Panel:  Recent Labs  81/19/14 0547  NA 147*  K 4.1  CL 110  CO2 28  GLUCOSE 147*  BUN 70*  CREATININE 1.53*  CALCIUM 9.3   Liver Function Tests: No results for input(s): AST, ALT, ALKPHOS, BILITOT, PROT, ALBUMIN in the last 72 hours. No results for input(s): LIPASE, AMYLASE in the last 72 hours. CBC: No results for input(s): WBC, NEUTROABS, HGB, HCT, MCV, PLT in the last 72 hours. Cardiac Enzymes: No results for input(s): CKTOTAL, CKMB, CKMBINDEX, TROPONINI in the last 72 hours. BNP: Invalid input(s): POCBNP D-Dimer: No results for input(s): DDIMER in the last 72 hours. Hemoglobin A1C: No results for input(s): HGBA1C in the last 72 hours. Fasting Lipid Panel: No results for input(s): CHOL, HDL, LDLCALC, TRIG, CHOLHDL, LDLDIRECT in the last 72 hours. Thyroid Function Tests: No results for input(s): TSH, T4TOTAL, T3FREE, THYROIDAB in the last 72 hours.  Invalid input(s): FREET3 Anemia Panel: No results for input(s): VITAMINB12, FOLATE, FERRITIN, TIBC, IRON, RETICCTPCT in the last 72 hours.   PHYSICAL EXAM General: Well developed, well nourished, in no acute distress HEENT:  Normocephalic and atramatic Neck:  No JVD.  Lungs: Clear bilaterally to auscultation and percussion. Heart: HRRR . Normal S1 and S2 without gallops or murmurs.  Abdomen: Bowel sounds are positive, abdomen soft and non-tender  Msk:  Back normal, normal gait. Normal strength and tone for age. Extremities: No clubbing, cyanosis  or edema.   Neuro: Alert and oriented X 3. 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 Blood pressures is stable. Continue Entresto and lasix and coreg.   Principal Problem:   COPD with acute exacerbation (HCC) Active Problems:   HTN (hypertension)   HLD (hyperlipidemia)   Acute on chronic combined systolic and diastolic CHF (congestive heart failure) (HCC)   CKD (chronic kidney disease), stage IV (HCC)   Respiratory distress   Anxiety state   Palliative care by specialist   Goals of care, counseling/discussion    Caroleen Hamman, NP-C 06/08/2017 11:59 AM

## 2017-06-08 NOTE — Care Management Important Message (Signed)
Important Message  Patient Details  Name: Erica [name] MRN: [id number] Date of Birth: 1941-05-10   Medicare Important Message Given:  Yes    Gwenette Greet, RN 06/08/2017, 8:44 AM

## 2017-06-08 NOTE — Clinical Social Work Note (Signed)
CSW was informed that patient will be going home with hospice now.  CSW to sign off please reconsult if social work needs arise.  Ervin Knack. Aubrei Bouchie, MSW, [facility] [phone]  06/08/2017 1:03 PM

## 2017-06-09 MED ORDER — MORPHINE SULFATE (CONCENTRATE) 10 MG/0.5ML PO SOLN: 10.0000 mg | ORAL | 0 refills | Status: AC | PRN

## 2017-06-09 MED ORDER — FENTANYL 25 MCG/HR TD PT72: 25.0000 ug | MEDICATED_PATCH | TRANSDERMAL | 0 refills | Status: AC

## 2017-06-09 NOTE — Progress Notes (Signed)
SUBJECTIVE: Patient is confused and not eating properly and not taking medication either   Vitals:   06/07/17 1014 06/07/17 1052 06/08/17 0424 06/08/17 0836  BP: (!) 146/78  (!) 130/47 (!) 108/58  Pulse: 90 86 (!) 111 (!) 113  Resp:  Temp: 98 F (36.7 C)  97.9 F (36.6 C) 98.2 F (36.8 C)  TempSrc: Oral  Oral Oral  SpO2: 100% 100% 100% 99%  Weight:      Height:       No intake or output data in the 24 hours ending 06/09/17 0951  LABS: Basic Metabolic Panel: No results for input(s): NA, K, CL, CO2, GLUCOSE, BUN, CREATININE, CALCIUM, MG, PHOS in the last 72 hours. Liver Function Tests: No results for input(s): AST, ALT, ALKPHOS, BILITOT, PROT, ALBUMIN in the last 72 hours. No results for input(s): LIPASE, AMYLASE in the last 72 hours. CBC: No results for input(s): WBC, NEUTROABS, HGB, HCT, MCV, PLT in the last 72 hours. Cardiac Enzymes: No results for input(s): CKTOTAL, CKMB, CKMBINDEX, TROPONINI in the last 72 hours. BNP: Invalid input(s): POCBNP D-Dimer: No results for input(s): DDIMER in the last 72 hours. Hemoglobin A1C: No results for input(s): HGBA1C in the last 72 hours. Fasting Lipid Panel: No results for input(s): CHOL, HDL, LDLCALC, TRIG, CHOLHDL, LDLDIRECT in the last 72 hours. Thyroid Function Tests: No results for input(s): TSH, T4TOTAL, T3FREE, THYROIDAB in the last 72 hours.  Invalid input(s): FREET3 Anemia Panel: No results for input(s): VITAMINB12, FOLATE, FERRITIN, TIBC, IRON, RETICCTPCT in the last 72 hours.   PHYSICAL EXAM General: Well developed, well nourished, in no acute distress HEENT:  Normocephalic and atramatic Neck:  No JVD.  Lungs: Clear bilaterally to auscultation and percussion. Heart: HRRR . Normal S1 and S2 without gallops or murmurs.  Abdomen: Bowel sounds are positive, abdomen soft and non-tender  Msk:  Back normal, normal gait. Normal strength and tone for age. Extremities: No clubbing, cyanosis or edema.   Neuro:  Alert and oriented X 3. Psych:  Good affect, responds appropriately  TELEMETRY:Atrial fibrillation  ASSESSMENT AND PLAN: Congestive heart failure with severe LV dysfunction. Patient is getting palliative care.  Principal Problem:   COPD with acute exacerbation (HCC) Active Problems:   HTN (hypertension)   HLD (hyperlipidemia)   Acute on chronic combined systolic and diastolic CHF (congestive heart failure) (HCC)   CKD (chronic kidney disease), stage IV (HCC)   Respiratory distress   Anxiety state   Palliative care by specialist   Goals of care, counseling/discussion    Erica [name] A, MD, [facility] 06/09/2017 9:51 AM

## 2017-06-09 NOTE — Care Management Note (Signed)
Case Management Note  Patient Details  Name: Erica [name] MRN: [id number] Date of Birth: 11/03/1940  Subjective/Objective:   To be discharged home today with hospice at [name] home. Erica [name] at [facility] reports that [name] hospital bed, new oxygen, and several other DME items have been ordered by [name] to be delivered to Erica [name] today prior to transport home via EMS. Per daughter Erica [name], no hospital bed has been delivered at this time. Per Erica [name], [facility] will assessment Erica [name] at 7:30pm.                   Action/Plan:   Expected Discharge Date:  06/09/17               Expected Discharge Plan:  Home w Hospice Care  In-House Referral:  NA  Discharge planning Services  CM Consult  Post Acute Care Choice:  Durable Medical Equipment, Hospice Choice offered to:  Patient, Adult Children  DME Arranged:  Hospital bed, Oxygen DME Agency:   ([facility])  HH Arranged:  RN, Disease Management ([facility] Agency:  [facility]  Status of Service:  Completed, signed off  If discussed at Long Length of Stay Meetings, dates discussed:    Additional Comments:  Erica [name] A, RN 06/09/2017, 1:50 PM

## 2017-06-09 NOTE — Progress Notes (Signed)
Pt being discharged home with hospice; AVS given and explained ; meds reviewed; awaiting EMS.

## 2017-06-09 NOTE — Clinical Social Work Note (Signed)
CSW contacted [facility] to check on bed availability. Currently, no beds are available. CSW will continue to follow for discharge transition.  Argentina Ponder, MSW, [facility] [phone]

## 2017-06-09 NOTE — Discharge Instructions (Signed)
Regular diet

## 2017-06-12 NOTE — Discharge Summary (Signed)
[facility] [place] [facility]   PATIENT NAME: Erica [name]    MR#:  [id number]  DATE OF BIRTH:  August 15, 1941  DATE OF ADMISSION:  06/01/2017 ADMITTING PHYSICIAN: Oralia Manis, MD  DATE OF DISCHARGE: 06/09/2017  6:00 PM  PRIMARY CARE PHYSICIAN: System, Pcp Not In   ADMISSION DIAGNOSIS:  Respiratory distress [R06.03] COPD exacerbation (HCC) [J44.1] COPD with acute exacerbation (HCC) [J44.1]  DISCHARGE DIAGNOSIS:  Principal Problem:   COPD with acute exacerbation (HCC) Active Problems:   HTN (hypertension)   HLD (hyperlipidemia)   Acute on chronic combined systolic and diastolic CHF (congestive heart failure) (HCC)   CKD (chronic kidney disease), stage IV (HCC)   Respiratory distress   Anxiety state   Palliative care by specialist   Goals of care, counseling/discussion   SECONDARY DIAGNOSIS:   Past Medical History:  Diagnosis Date  . Chronic combined systolic and diastolic CHF (congestive heart failure) (HCC)   . CKD (chronic kidney disease)   . COPD (chronic obstructive pulmonary disease) (HCC)   . HLD (hyperlipidemia)   . HTN (hypertension)    ADMITTING HISTORY  HISTORY OF PRESENT ILLNESS:  Erica [name]  is a 76 y.o. female who presents with Progressive shortness of breath over the last several days. Patient has a history of severe COPD as well as congestive heart failure. She came in to the ED today when her symptoms became severe. Initially she was somewhat hypoxic and required BiPAP. She is able to wean off the BiPAP in the ED. Workup was consistent with COPD exacerbation, as well as potentially some mild heart failure exacerbation. Hospitalists were called for admission  HOSPITAL COURSE:   * Acute on chronic hypoxic respiratory failure * Acute COPD exacerbation * Acute on chronic diastolic congestive heart failure * Depression * Hypertension * CKD stage IV  Patient was aggressively treated being on a BiPAP in the ICU. Slowly  transitioned to 6 L oxygen. She did not improve in spite of aggressive treatment and case was discussed with family and transitioned to comfort measures. At the time of discharge patient is unable to participate in any discussion. I have discussed with her daughters. Patient is being discharged home with Roxanol. Hospice has been set up. A hospital bed and other necessary equipment had been delivered.  Very poor prognosis.  CONSULTS OBTAINED:  Treatment Team:  Laurier Nancy, MD  DRUG ALLERGIES:  No Known Allergies  DISCHARGE MEDICATIONS:   Discharge Medication List as of 06/09/2017  2:51 PM    START taking these medications   Details  fentaNYL (DURAGESIC - DOSED MCG/HR) 25 MCG/HR patch Place 1 patch (25 mcg total) onto the skin every 3 (three) days., Starting Sat 06/09/2017, Print    Morphine Sulfate (MORPHINE CONCENTRATE) 10 MG/0.5ML SOLN concentrated solution Place 0.5 mLs (10 mg total) under the tongue every 2 (two) hours as needed for moderate pain, severe pain or shortness of breath (air hunger)., Starting Sat 06/09/2017, Print      CONTINUE these medications which have NOT CHANGED   Details  lidocaine (XYLOCAINE) 5 % ointment Apply 1 application topically daily as needed., Historical Med      STOP taking these medications     allopurinol (ZYLOPRIM) 100 MG tablet      busPIRone (BUSPAR) 7.5 MG tablet      citalopram (CELEXA) 20 MG tablet      fluticasone (FLONASE) 50 MCG/ACT nasal spray      metolazone (ZAROXOLYN) 2.5 MG tablet  mirtazapine (REMERON) 15 MG tablet      omeprazole (PRILOSEC) 40 MG capsule      oxyCODONE (OXY IR/ROXICODONE) 5 MG immediate release tablet      predniSONE (DELTASONE) 1 MG tablet      roflumilast (DALIRESP) 500 MCG TABS tablet      spironolactone (ALDACTONE) 25 MG tablet      tiotropium (SPIRIVA) 18 MCG inhalation capsule         Today   VITAL SIGNS:  Blood pressure (!) 108/58, pulse (!) 113, temperature 98.2 F (36.8  C), temperature source Oral, resp. rate 20, height  (1.651 m), weight 65 kg (143 lb 4.8 oz), SpO2 99 %.  I/O:  No intake or output data in the 24 hours ending 06/12/17 1304  PHYSICAL EXAMINATION:  Physical Exam  GENERAL:  76 y.o.-year-old patient lying in the bed with no acute distress.  LUNGS: Normal breath sounds bilaterally, no wheezing, rales,rhonchi or crepitation. No use of accessory muscles of respiration.  CARDIOVASCULAR: S1, S2 normal. No murmurs, rubs, or gallops.  ABDOMEN: Soft, non-tender, non-distended. Bowel sounds present. No organomegaly or mass.  NEUROLOGIC: Moves all 4 extremities. PSYCHIATRIC: The patient is alert and oriented x 3.  SKIN: No obvious rash, lesion, or ulcer.   DATA REVIEW:   CBC No results for input(s): WBC, HGB, HCT, PLT in the last 168 hours.  Chemistries   Recent Labs Lab 06/06/17 0547  NA 147*  K 4.1  CL 110  CO2 28  GLUCOSE 147*  BUN 70*  CREATININE 1.53*  CALCIUM 9.3    Cardiac Enzymes No results for input(s): TROPONINI in the last 168 hours.  Microbiology Results  Results for orders placed or performed during the hospital encounter of 06/01/17  Culture, blood (routine x 2)     Status: None   Collection Time: 06/01/17  7:16 PM  Result Value Ref Range Status   Specimen Description BLOOD LEFT HAND  Final   Special Requests   Final    BOTTLES DRAWN AEROBIC AND ANAEROBIC Blood Culture adequate volume   Culture NO GROWTH 5 DAYS  Final   Report Status 06/06/2017 FINAL  Final  Culture, blood (routine x 2)     Status: Abnormal   Collection Time: 06/01/17  7:16 PM  Result Value Ref Range Status   Specimen Description BLOOD BLOOD LEFT WRIST  Final   Special Requests   Final    BOTTLES DRAWN AEROBIC AND ANAEROBIC Blood Culture adequate volume   Culture  Setup Time   Final    GRAM POSITIVE COCCI ANAEROBIC BOTTLE ONLY CRITICAL RESULT CALLED TO, READ BACK BY AND VERIFIED WITH: NATE COOKSON ON 06/02/17 AT 1957 QSD    Culture  (A)  Final    STAPHYLOCOCCUS SPECIES (COAGULASE NEGATIVE) THE SIGNIFICANCE OF ISOLATING THIS ORGANISM FROM A SINGLE SET OF BLOOD CULTURES WHEN MULTIPLE SETS ARE DRAWN IS UNCERTAIN. PLEASE NOTIFY THE MICROBIOLOGY DEPARTMENT WITHIN ONE WEEK IF SPECIATION AND SENSITIVITIES ARE REQUIRED. Performed at [facility], [address], [place], [place] [address]    Report Status 06/05/2017 FINAL  Final  Blood Culture ID Panel (Reflexed)     Status: Abnormal   Collection Time: 06/01/17  7:16 PM  Result Value Ref Range Status   Enterococcus species NOT DETECTED NOT DETECTED Final   Listeria monocytogenes NOT DETECTED NOT DETECTED Final   Staphylococcus species DETECTED (A) NOT DETECTED Final    Comment: Methicillin (oxacillin) resistant coagulase negative staphylococcus. Possible blood culture contaminant (unless  isolated from more than one blood culture draw or clinical case suggests pathogenicity). No antibiotic treatment is indicated for blood  culture contaminants. CRITICAL RESULT CALLED TO, READ BACK BY AND VERIFIED WITH: NATE COOKSON ON 06/02/17 AT 1957 QSD    Staphylococcus aureus NOT DETECTED NOT DETECTED Final   Methicillin resistance DETECTED (A) NOT DETECTED Final    Comment: CRITICAL RESULT CALLED TO, READ BACK BY AND VERIFIED WITH: NATE COOKSON ON 06/02/17 AT 1957 QSD    Streptococcus species NOT DETECTED NOT DETECTED Final   Streptococcus agalactiae NOT DETECTED NOT DETECTED Final   Streptococcus pneumoniae NOT DETECTED NOT DETECTED Final   Streptococcus pyogenes NOT DETECTED NOT DETECTED Final   Acinetobacter baumannii NOT DETECTED NOT DETECTED Final   Enterobacteriaceae species NOT DETECTED NOT DETECTED Final   Enterobacter cloacae complex NOT DETECTED NOT DETECTED Final   Escherichia coli NOT DETECTED NOT DETECTED Final   Klebsiella oxytoca NOT DETECTED NOT DETECTED Final   Klebsiella pneumoniae NOT DETECTED NOT DETECTED Final   Proteus species NOT DETECTED NOT DETECTED Final    Serratia marcescens NOT DETECTED NOT DETECTED Final   Haemophilus influenzae NOT DETECTED NOT DETECTED Final   Neisseria meningitidis NOT DETECTED NOT DETECTED Final   Pseudomonas aeruginosa NOT DETECTED NOT DETECTED Final   Candida albicans NOT DETECTED NOT DETECTED Final   Candida glabrata NOT DETECTED NOT DETECTED Final   Candida krusei NOT DETECTED NOT DETECTED Final   Candida parapsilosis NOT DETECTED NOT DETECTED Final   Candida tropicalis NOT DETECTED NOT DETECTED Final  MRSA PCR Screening     Status: None   Collection Time: 06/02/17  5:18 AM  Result Value Ref Range Status   MRSA by PCR NEGATIVE NEGATIVE Final    Comment:        The GeneXpert MRSA Assay (FDA approved for NASAL specimens only), is one component of a comprehensive MRSA colonization surveillance program. It is not intended to diagnose MRSA infection nor to guide or monitor treatment for MRSA infections.     RADIOLOGY:  No results found.  Follow up with PCP in 1 week.  Management plans discussed with the patient, family and they are in agreement.  CODE STATUS:  Code Status History    Date Active Date Inactive Code Status Order ID Comments User Context   06/04/2017 12:24 PM 06/09/2017  9:27 PM DNR [id number]  Erin Fulling, MD Inpatient   06/02/2017 12:18 AM 06/04/2017 12:24 PM Full Code [id number]  Oralia Manis, MD Inpatient    Questions for Most Recent Historical Code Status (Order [id number])    Question Answer Comment   In the event of cardiac or respiratory ARREST Do not call a "code blue"    In the event of cardiac or respiratory ARREST Do not perform Intubation, CPR, defibrillation or ACLS    In the event of cardiac or respiratory ARREST Use medication by any route, position, wound care, and other measures to relive pain and suffering. May use oxygen, suction and manual treatment of airway obstruction as needed for comfort.         Advance Directive Documentation     Most Recent Value  Type of  Advance Directive  Healthcare Power of Attorney  Pre-existing out of facility DNR order (yellow form or pink MOST form)  -  "MOST" Form in Place?  -      TOTAL TIME TAKING CARE OF THIS PATIENT ON DAY OF DISCHARGE: more than 30 minutes.   Milagros Loll R M.D on 06/12/2017 at  1:04 PM  Between 7am to 6pm - Pager - [phone]  After 6pm go to [email or web address] - password [email or web address]  [facility]  Office  [phone]  CC: Primary care physician; System, Pcp Not In  Note: This dictation was prepared with Dragon dictation along with smaller phrase technology. Any transcriptional errors that result from this process are unintentional.

## 2017-06-13 ENCOUNTER — Ambulatory Visit: Payer: Medicare Other | Admitting: Family

## 2017-06-28 DEATH — deceased

## 2019-04-26 IMAGING — DX DG CHEST 1V PORT
2 series · 2 of 2 positions shown · non-contrast
Comparison: None.

CLINICAL DATA: Shortness of breath

EXAM:
PORTABLE CHEST 1 VIEW

[chest ap (1 of 2)]
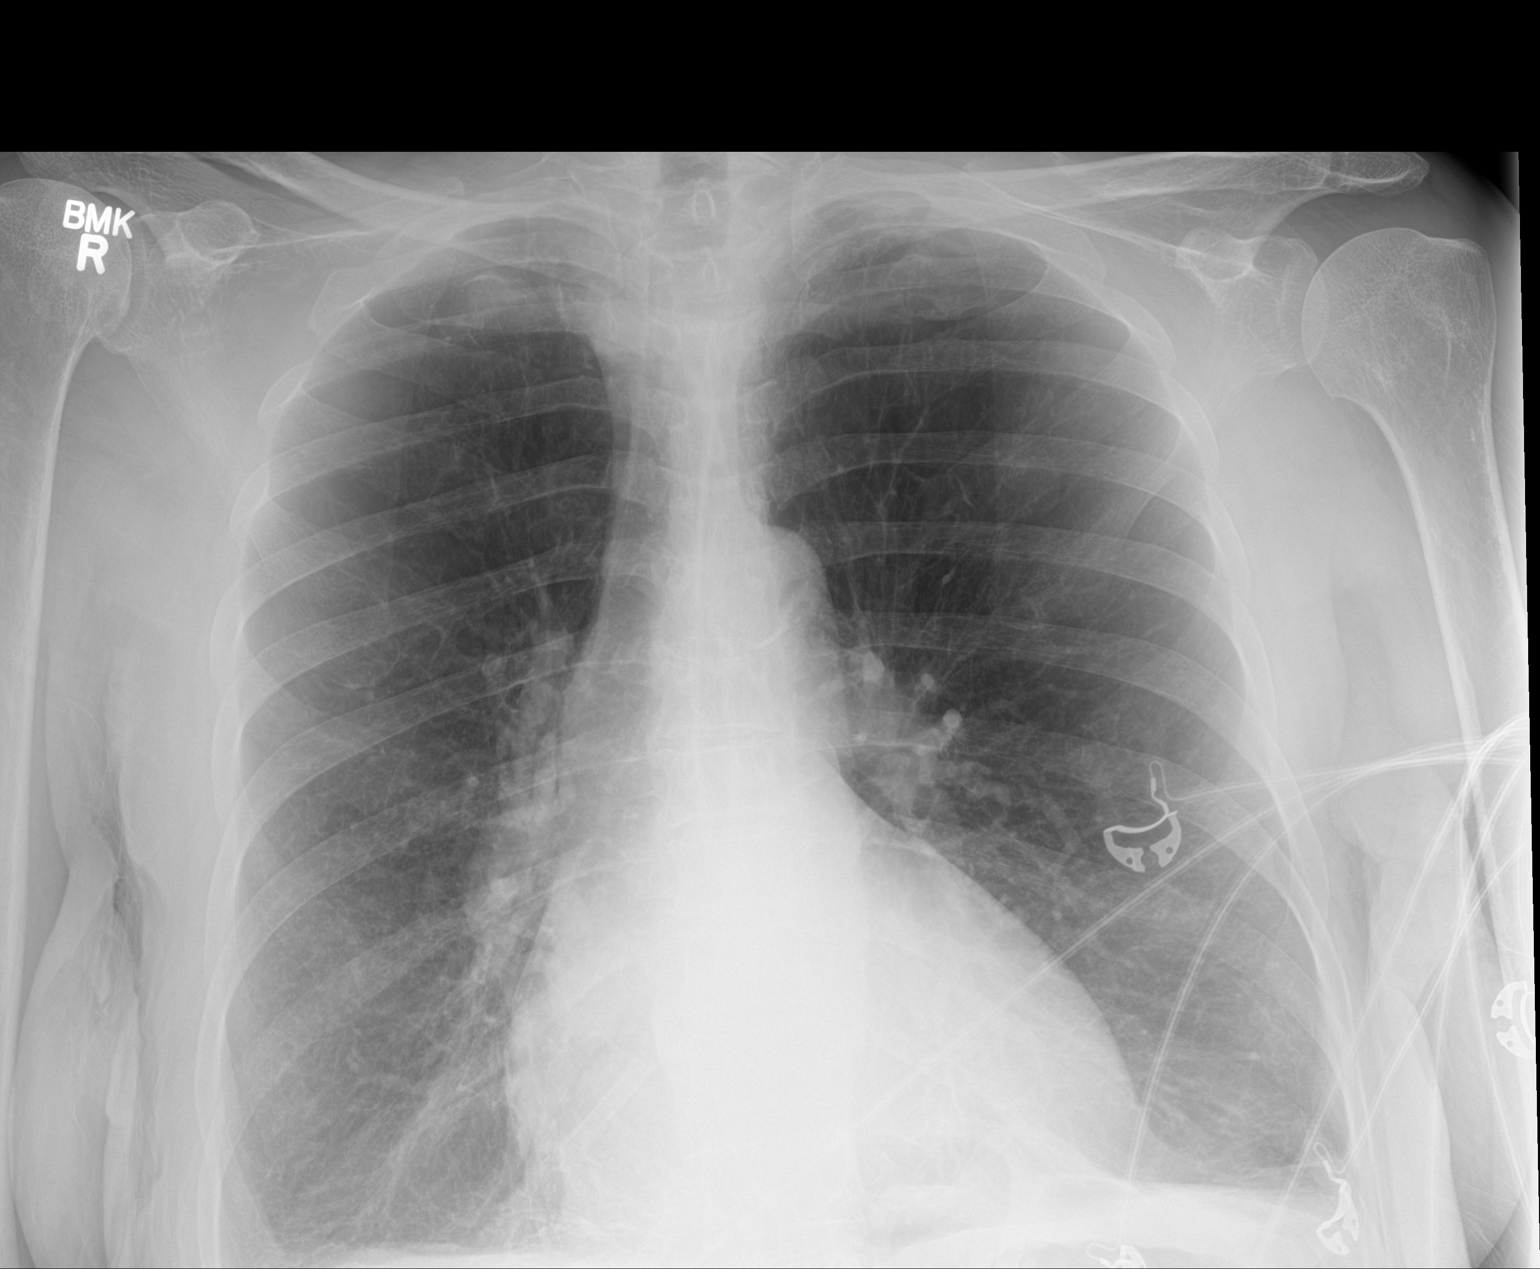

[chest ap (2 of 2)]
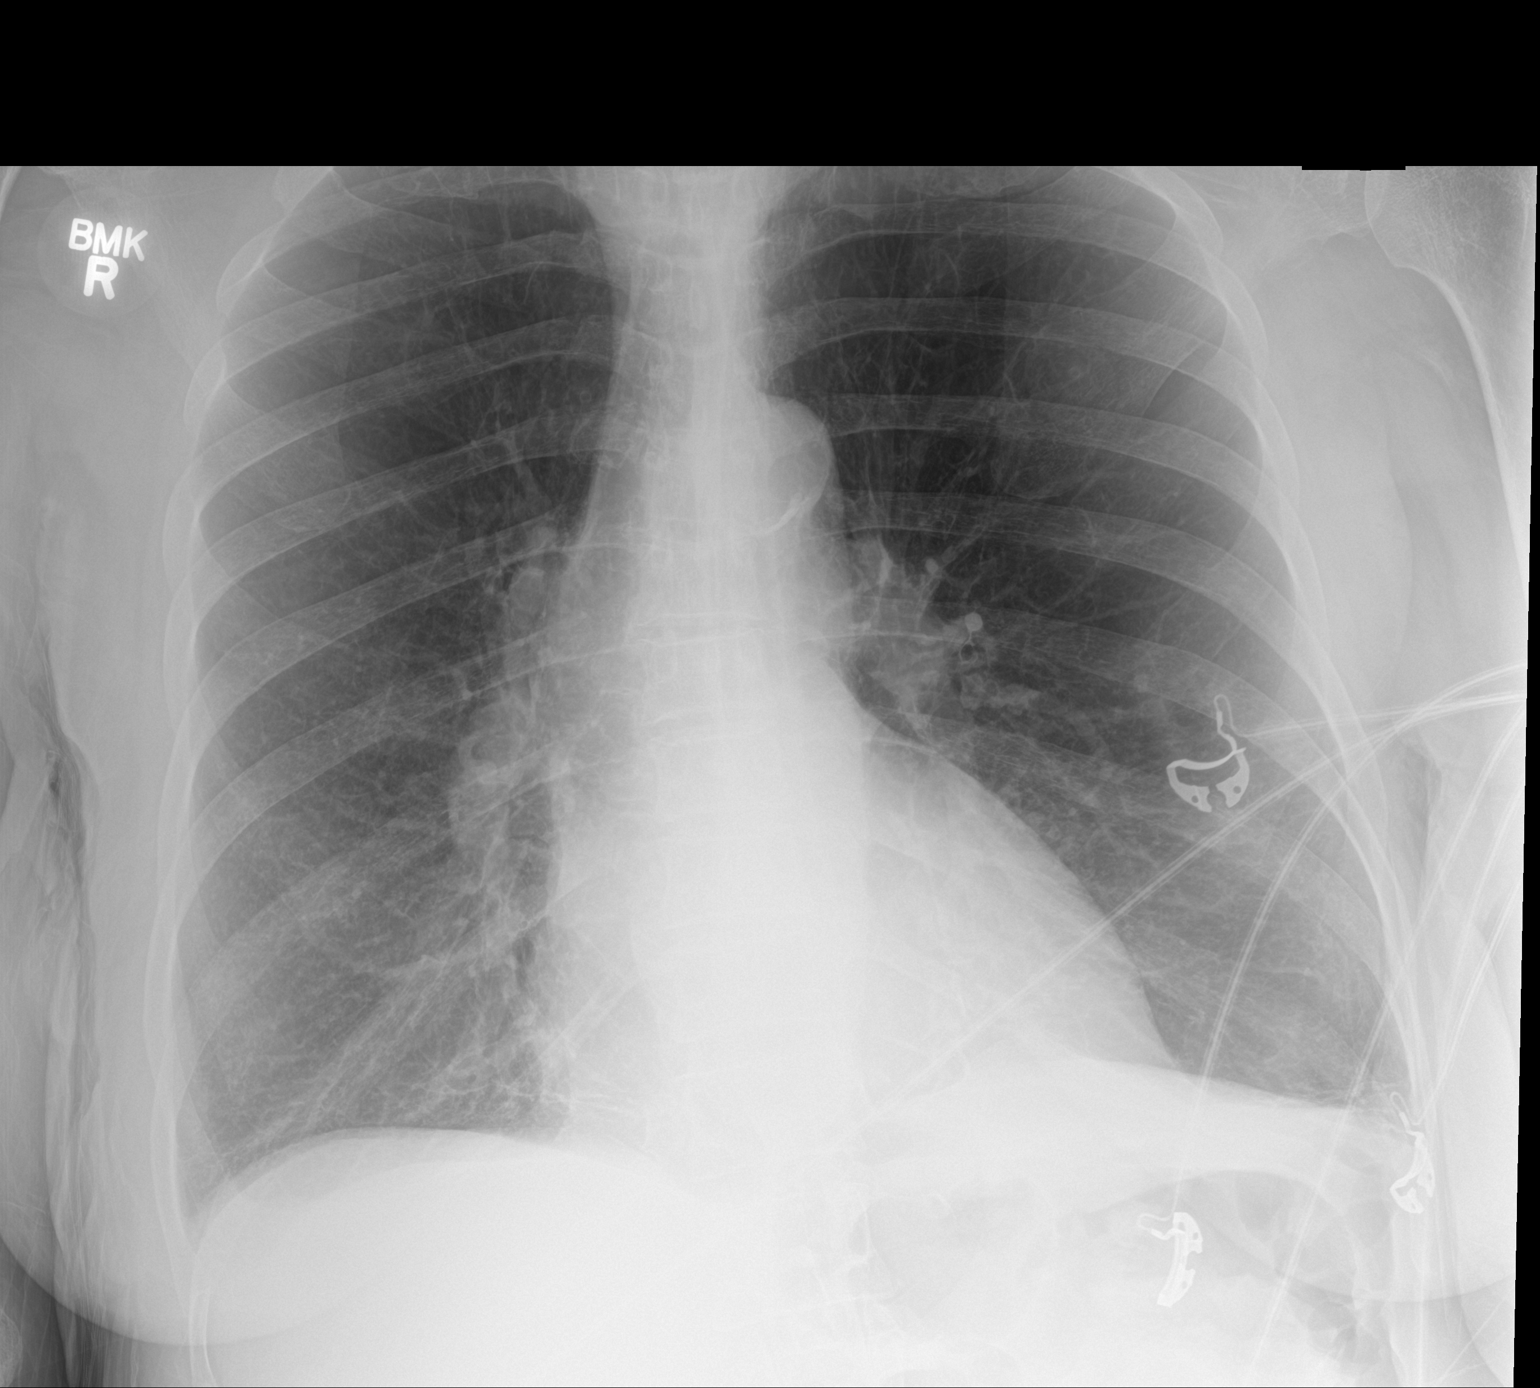

[2 of 2 positions shown; findings below may reference images not displayed]

FINDINGS: Hyperinflation with emphysematous changes. No acute consolidation.
Linear scarring or atelectasis at the bases. No pleural effusion.
Borderline cardiomegaly. Aortic atherosclerosis. No pneumothorax.
IMPRESSION: Hyperinflation with emphysematous changes. Linear scarring or
atelectasis at both bases

## 2019-04-28 IMAGING — DX DG CHEST 1V PORT
1 series · 1 of 1 positions shown · non-contrast
Comparison: 06/01/2017

CLINICAL DATA: Coughing, weakness, diaphoretic, dyspnea

EXAM:
PORTABLE CHEST 1 VIEW

[chest ap]
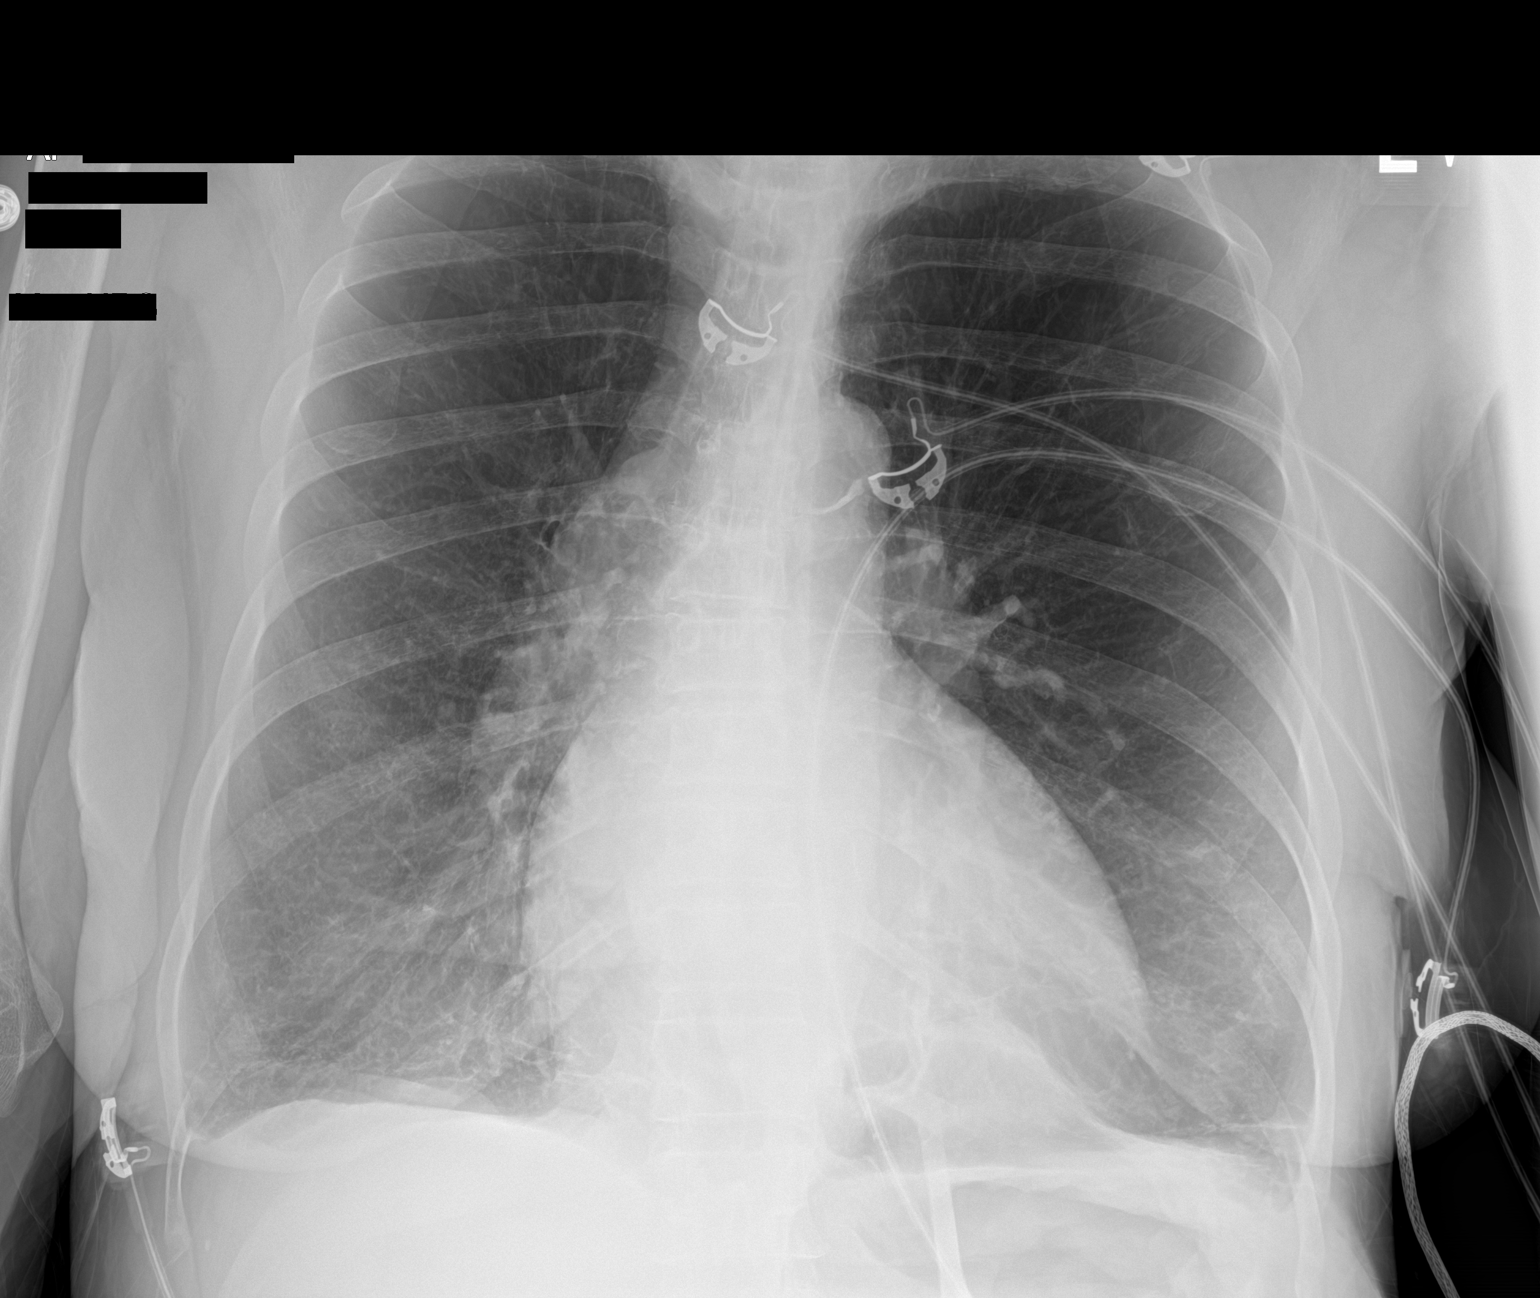

[1 of 1 positions shown; findings below may reference images not displayed]

FINDINGS: Cardiac silhouette is borderline enlarged. No mediastinal or hilar
masses. No convincing adenopathy.

Lungs are hyperexpanded. Stable scarring in the lung bases. No
evidence of pneumonia or pulmonary edema.

No pleural effusion or pneumothorax.

Skeletal structures are grossly intact.
IMPRESSION: 1. No acute cardiopulmonary disease.
2. COPD.
